# Patient Record
Sex: Male | Born: 1945 | Race: White | Hispanic: No | Smoking: Former smoker
Health system: Southern US, Community
[De-identification: ages and names within clinical notes are randomized; demographics above are authoritative.]

## PROBLEM LIST (undated history)

## (undated) DIAGNOSIS — I219 Acute myocardial infarction, unspecified: Secondary | ICD-10-CM

## (undated) DIAGNOSIS — G4733 Obstructive sleep apnea (adult) (pediatric): Secondary | ICD-10-CM

## (undated) DIAGNOSIS — E785 Hyperlipidemia, unspecified: Secondary | ICD-10-CM

## (undated) HISTORY — DX: Hyperlipidemia, unspecified: E78.5

## (undated) HISTORY — DX: Acute myocardial infarction, unspecified: I21.9

## (undated) HISTORY — DX: Obstructive sleep apnea (adult) (pediatric): G47.33

## (undated) HISTORY — PX: ANGIOPLASTY: SHX39

## (undated) HISTORY — PX: CHOLECYSTECTOMY: SHX55

## (undated) HISTORY — PX: COLON SURGERY: SHX602

---

## 1999-02-04 ENCOUNTER — Other Ambulatory Visit: Admission: RE | Admit: 1999-02-04 | Discharge: 1999-02-04 | Payer: Self-pay | Admitting: Urology

## 1999-03-08 ENCOUNTER — Encounter: Payer: Self-pay | Admitting: Urology

## 1999-03-10 ENCOUNTER — Encounter (INDEPENDENT_AMBULATORY_CARE_PROVIDER_SITE_OTHER): Payer: Self-pay | Admitting: *Deleted

## 1999-03-10 ENCOUNTER — Inpatient Hospital Stay (HOSPITAL_COMMUNITY): Admission: RE | Admit: 1999-03-10 | Discharge: 1999-03-13 | Payer: Self-pay | Admitting: Urology

## 1999-05-04 ENCOUNTER — Ambulatory Visit (HOSPITAL_COMMUNITY): Admission: RE | Admit: 1999-05-04 | Discharge: 1999-05-04 | Payer: Self-pay | Admitting: *Deleted

## 1999-05-04 ENCOUNTER — Encounter (INDEPENDENT_AMBULATORY_CARE_PROVIDER_SITE_OTHER): Payer: Self-pay

## 2000-09-06 ENCOUNTER — Encounter (INDEPENDENT_AMBULATORY_CARE_PROVIDER_SITE_OTHER): Payer: Self-pay | Admitting: Specialist

## 2000-09-06 ENCOUNTER — Ambulatory Visit (HOSPITAL_COMMUNITY): Admission: RE | Admit: 2000-09-06 | Discharge: 2000-09-06 | Payer: Self-pay | Admitting: *Deleted

## 2000-10-27 ENCOUNTER — Encounter: Admission: RE | Admit: 2000-10-27 | Discharge: 2000-11-07 | Payer: Self-pay | Admitting: Surgery

## 2000-10-27 ENCOUNTER — Encounter: Payer: Self-pay | Admitting: Surgery

## 2000-11-03 ENCOUNTER — Encounter (INDEPENDENT_AMBULATORY_CARE_PROVIDER_SITE_OTHER): Payer: Self-pay | Admitting: Specialist

## 2000-11-03 ENCOUNTER — Inpatient Hospital Stay (HOSPITAL_COMMUNITY): Admission: RE | Admit: 2000-11-03 | Discharge: 2000-11-07 | Payer: Self-pay | Admitting: Surgery

## 2002-07-02 ENCOUNTER — Ambulatory Visit (HOSPITAL_COMMUNITY): Admission: RE | Admit: 2002-07-02 | Discharge: 2002-07-02 | Payer: Self-pay | Admitting: *Deleted

## 2002-07-02 ENCOUNTER — Encounter (INDEPENDENT_AMBULATORY_CARE_PROVIDER_SITE_OTHER): Payer: Self-pay | Admitting: Specialist

## 2003-05-30 ENCOUNTER — Encounter (INDEPENDENT_AMBULATORY_CARE_PROVIDER_SITE_OTHER): Payer: Self-pay | Admitting: *Deleted

## 2003-05-30 ENCOUNTER — Ambulatory Visit (HOSPITAL_COMMUNITY): Admission: RE | Admit: 2003-05-30 | Discharge: 2003-05-30 | Payer: Self-pay | Admitting: *Deleted

## 2007-05-14 DIAGNOSIS — J984 Other disorders of lung: Secondary | ICD-10-CM | POA: Insufficient documentation

## 2007-05-19 ENCOUNTER — Ambulatory Visit: Payer: Self-pay | Admitting: Critical Care Medicine

## 2007-05-19 ENCOUNTER — Ambulatory Visit: Payer: Self-pay | Admitting: Cardiovascular Disease

## 2007-05-19 ENCOUNTER — Inpatient Hospital Stay (HOSPITAL_COMMUNITY): Admission: EM | Admit: 2007-05-19 | Discharge: 2007-06-01 | Payer: Self-pay | Admitting: Emergency Medicine

## 2007-05-22 ENCOUNTER — Encounter (INDEPENDENT_AMBULATORY_CARE_PROVIDER_SITE_OTHER): Payer: Self-pay | Admitting: Cardiology

## 2007-06-03 ENCOUNTER — Ambulatory Visit: Payer: Self-pay | Admitting: *Deleted

## 2007-06-04 ENCOUNTER — Inpatient Hospital Stay (HOSPITAL_COMMUNITY): Admission: EM | Admit: 2007-06-04 | Discharge: 2007-06-06 | Payer: Self-pay | Admitting: Emergency Medicine

## 2007-06-04 ENCOUNTER — Ambulatory Visit: Payer: Self-pay | Admitting: Internal Medicine

## 2007-06-05 ENCOUNTER — Encounter: Payer: Self-pay | Admitting: Pulmonary Disease

## 2007-06-06 ENCOUNTER — Encounter: Payer: Self-pay | Admitting: Pulmonary Disease

## 2007-07-12 ENCOUNTER — Ambulatory Visit: Payer: Self-pay | Admitting: Pulmonary Disease

## 2007-07-12 DIAGNOSIS — J8410 Pulmonary fibrosis, unspecified: Secondary | ICD-10-CM | POA: Insufficient documentation

## 2007-07-12 DIAGNOSIS — R05 Cough: Secondary | ICD-10-CM

## 2007-07-12 DIAGNOSIS — R058 Other specified cough: Secondary | ICD-10-CM | POA: Insufficient documentation

## 2007-07-12 DIAGNOSIS — J449 Chronic obstructive pulmonary disease, unspecified: Secondary | ICD-10-CM | POA: Insufficient documentation

## 2007-08-21 ENCOUNTER — Ambulatory Visit: Payer: Self-pay | Admitting: Pulmonary Disease

## 2007-08-21 DIAGNOSIS — I509 Heart failure, unspecified: Secondary | ICD-10-CM | POA: Insufficient documentation

## 2009-05-07 IMAGING — CR DG CHEST 1V PORT
1 series · 1 of 1 positions shown · non-contrast
Comparison: none
 The heart is enlarged.

CLINICAL DATA: 61 year-old with chest pain.  Code stemi.
 PORTABLE CHEST- 1 VIEW:

[view not recorded]
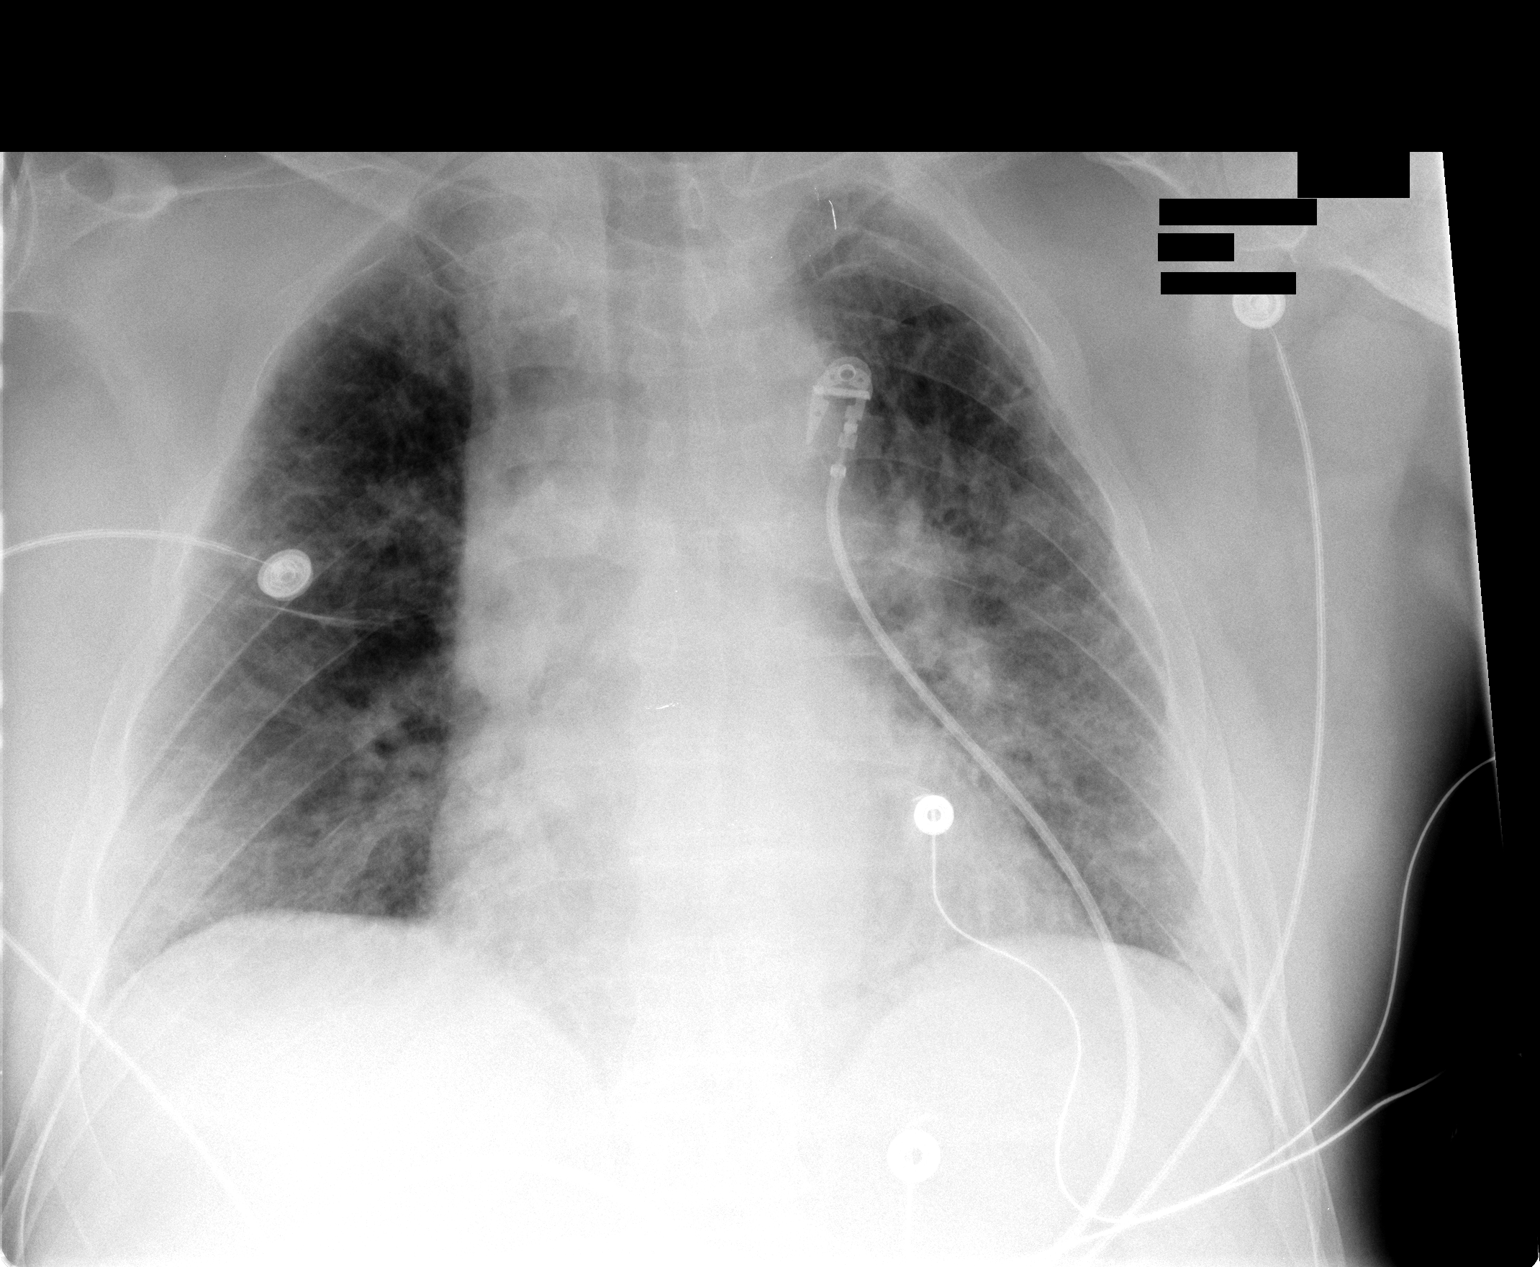

[1 of 1 positions shown; findings below may reference images not displayed]

There are patchy densities bilaterally, likely represents an interstitial pulmonary edema.  Infectious process is also a consideration however.
IMPRESSION: Cardiomegaly and bilateral densities, likely related to edema.

## 2009-05-09 IMAGING — CR DG CHEST 1V PORT
1 series · 1 of 1 positions shown · non-contrast
Comparison: 05/20/2007.

Portable AP chest, 05/21/2007.
INDICATION: Short of breath.

[view not recorded]
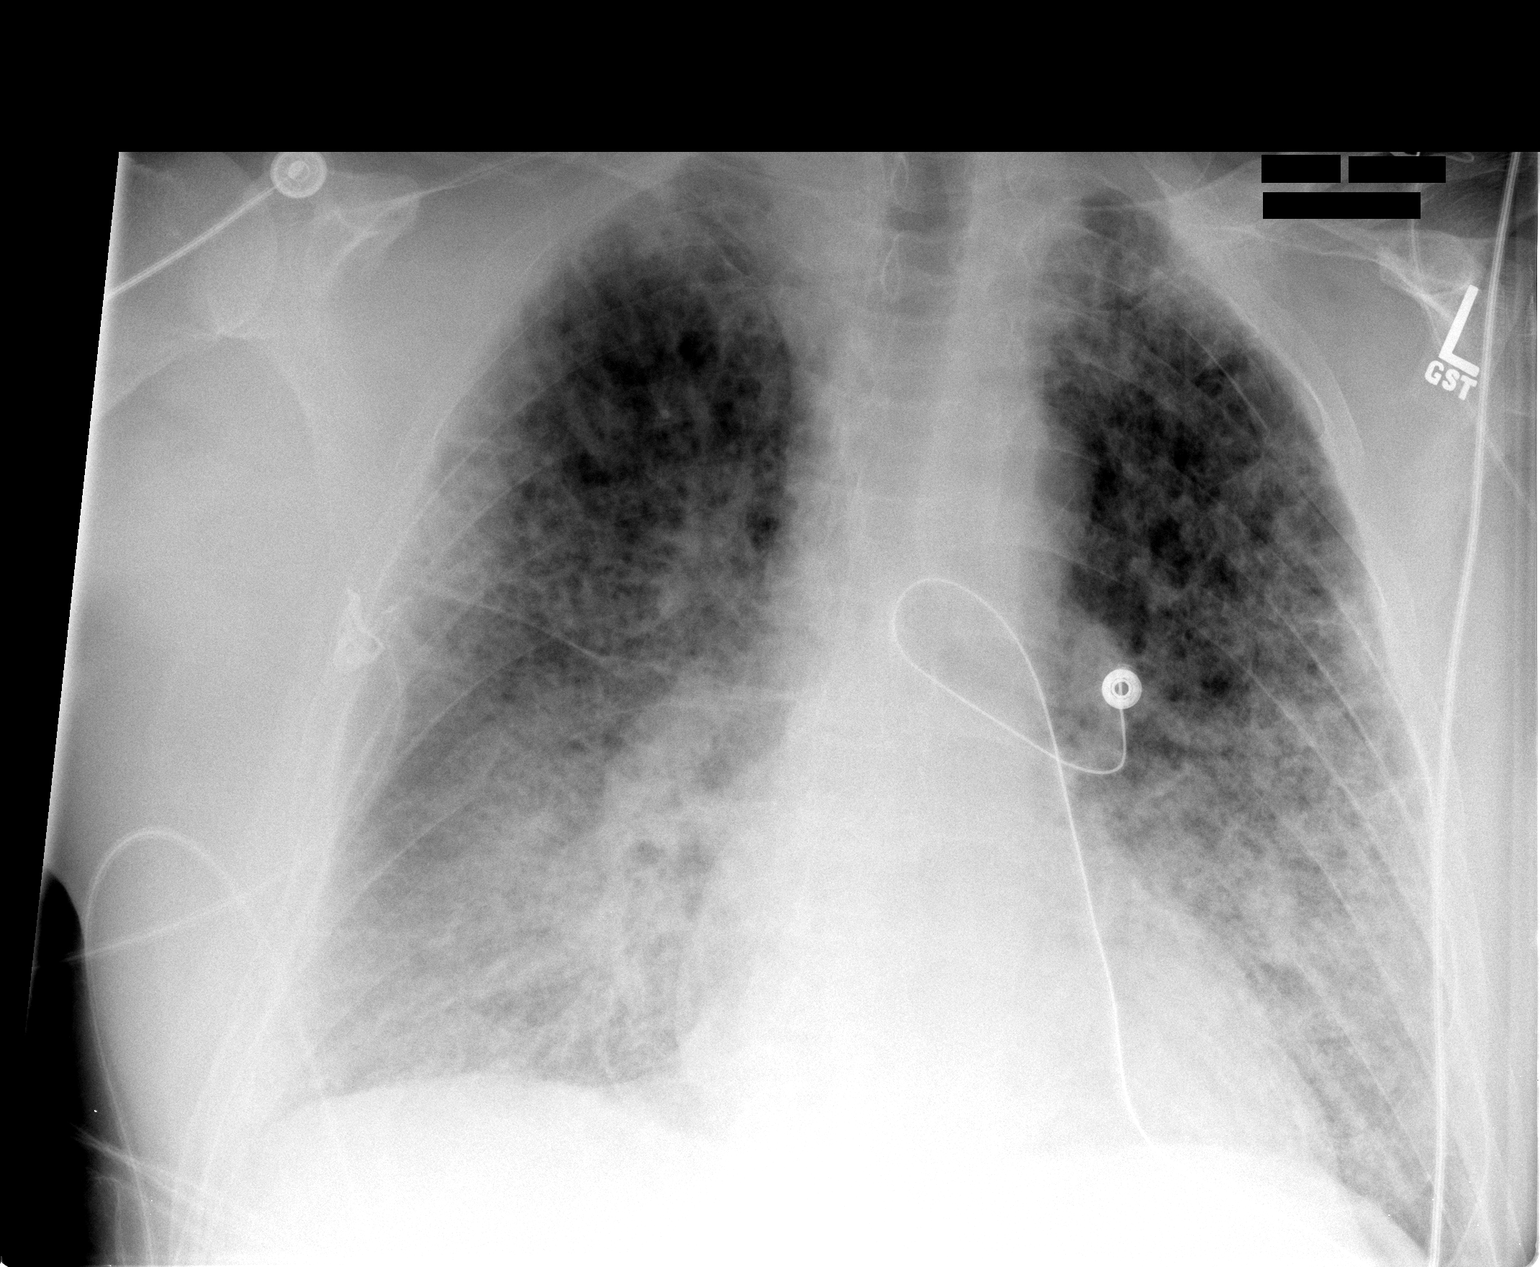

[1 of 1 positions shown; findings below may reference images not displayed]

FINDINGS: Since yesterday's exam, there has been interval development of florid
perihilar pulmonary edema superimposed on background pulmonary fibrosis.
Cardiomediastinal silhouette is unchanged. Left costophrenic angle excluded.
IMPRESSION: Development of severe perihilar pulmonary edema, likely cardiogenic.

## 2009-05-12 IMAGING — CR DG CHEST 1V PORT
1 series · 1 of 1 positions shown · non-contrast
Comparison: 05/23/07.

CLINICAL DATA: Myocardial infarction.

[view not recorded]
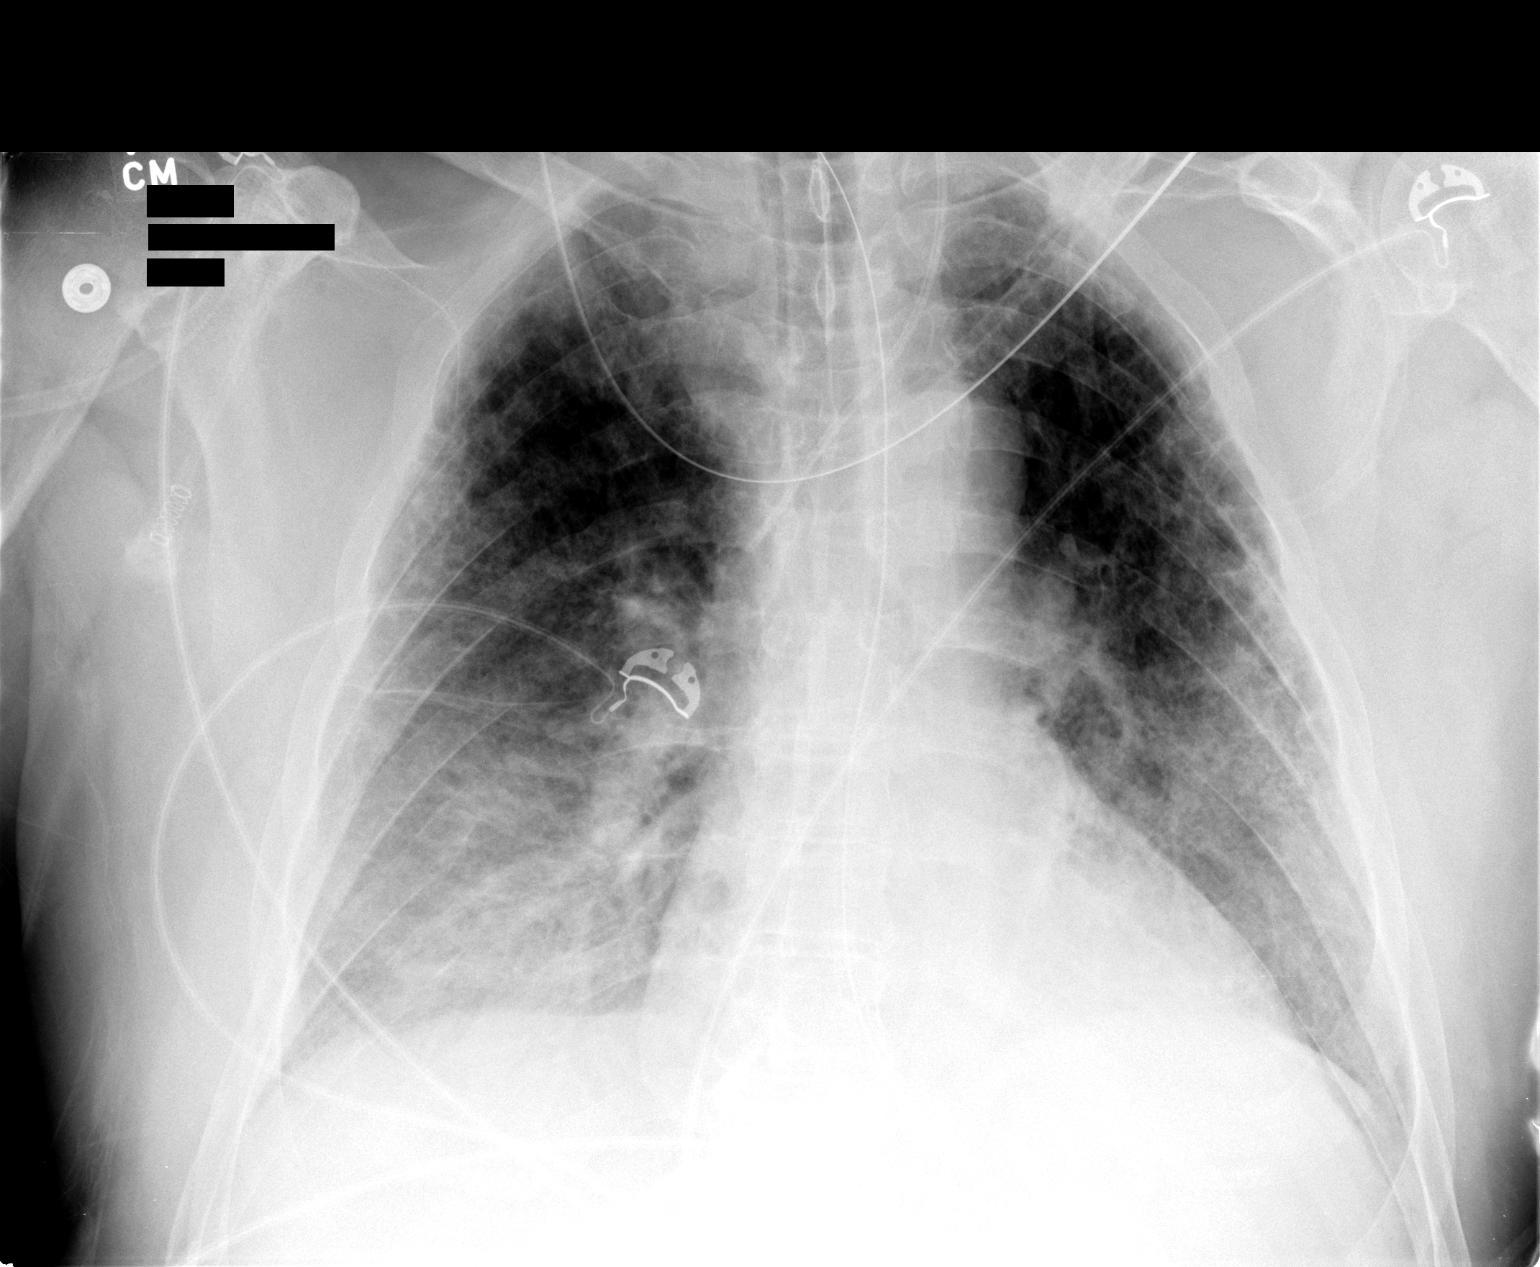

[1 of 1 positions shown; findings below may reference images not displayed]

FINDINGS: The patient has been intubated with the ETT in good position just below the clavicular heads.  Bilateral airspace disease persists although it is slightly improved. Heart size upper normal.
IMPRESSION: 1.  ET tube is now in place with the tip in good position.
 2.  Some improvement in aeration.

## 2009-05-13 IMAGING — CT CT ANGIO CHEST
2 of 8 series · 17 of 36 positions shown · IV contrast (100 ML OMNI 300)
Comparison: none

CLINICAL DATA: 61-year-old male with MI.  Rule out PE.
CT ANGIOGRAPHY OF CHEST:
TECHNIQUE: Multidetector CT imaging of the chest was performed during bolus injection of intravenous contrast.  Multiplanar CT angiographic image reconstructions were generated to evaluate the vascular anatomy.
Contrast:  100 cc Omnipaque 300

[Series 3: pe · axial · 0.80mm/px · z∈[-288,-21]mm · 14 of 247 slices shown]
[im 17/247  lung]
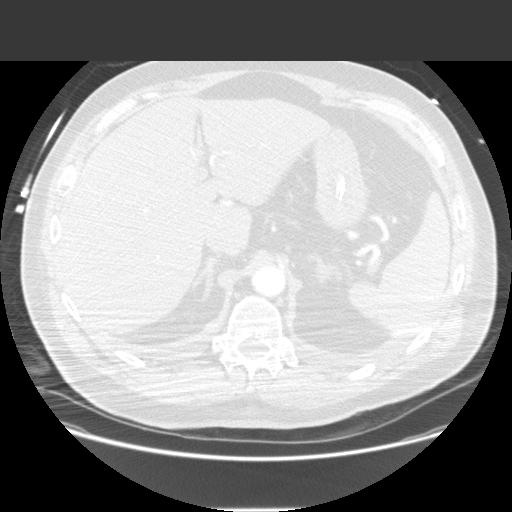
[im 33/247  mediastinal]
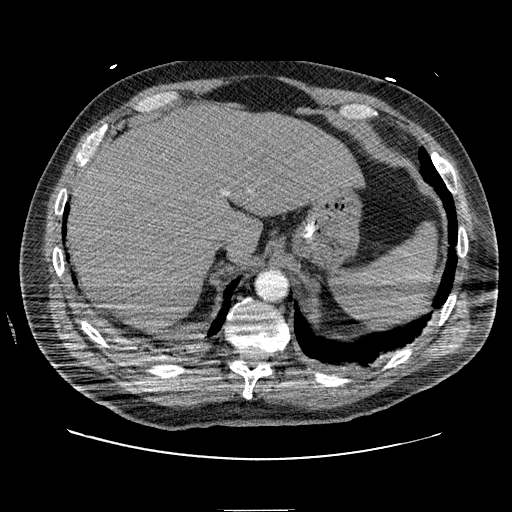
[im 50/247  lung]
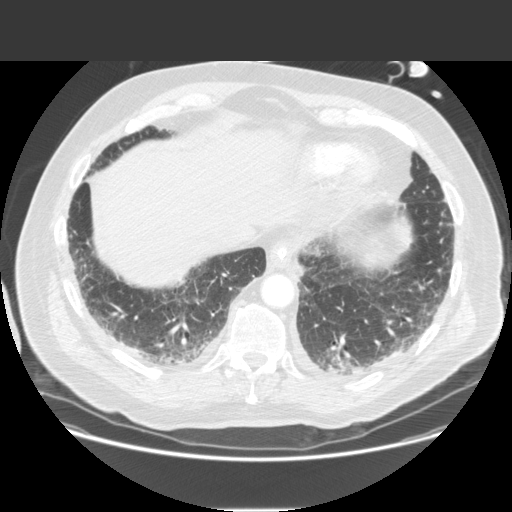
[im 66/247  mediastinal]
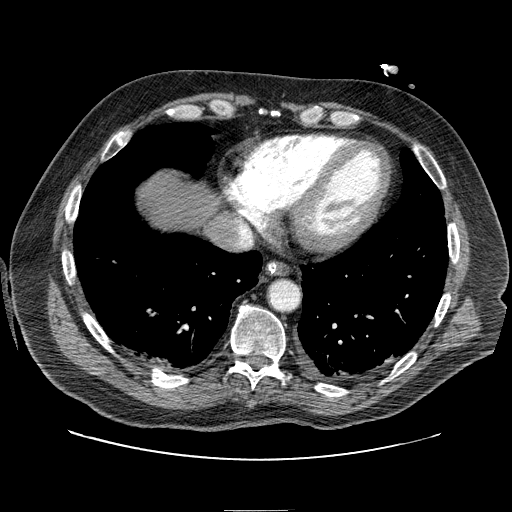
[im 83/247  lung]
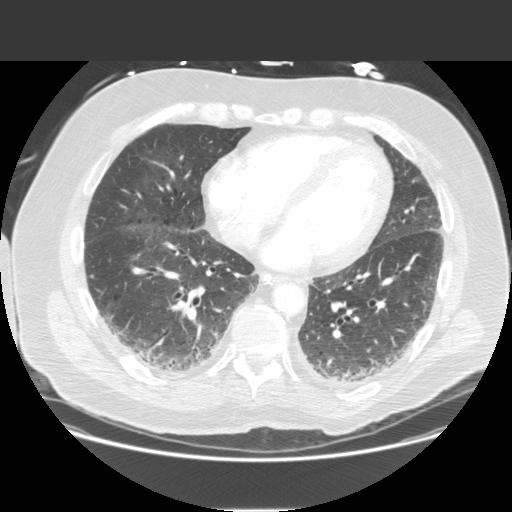
[im 99/247  mediastinal]
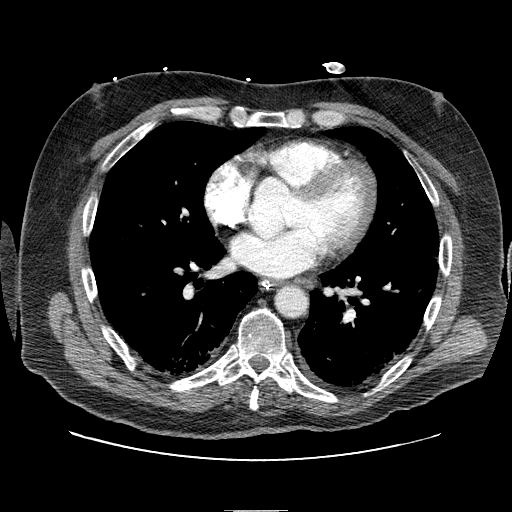
[im 115/247  lung]
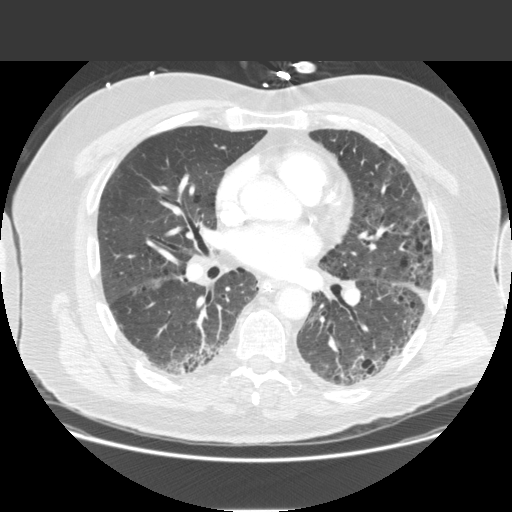
[im 132/247  mediastinal]
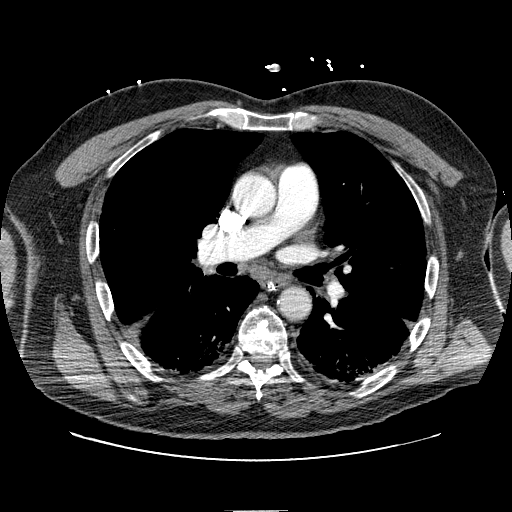
[im 148/247  lung]
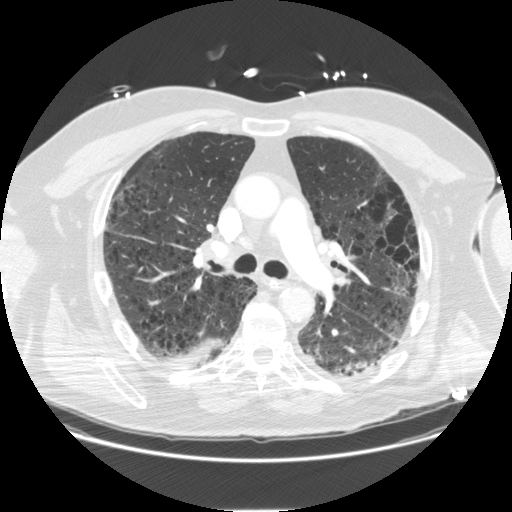
[im 165/247  mediastinal]
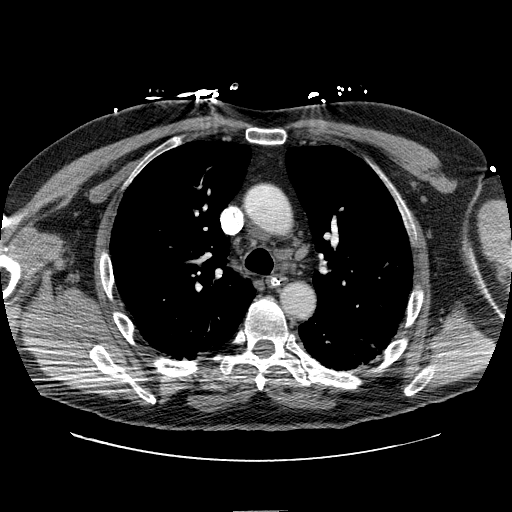
[im 181/247  lung]
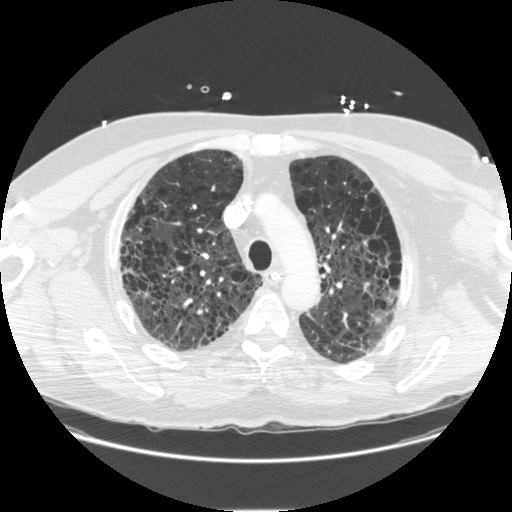
[im 197/247  mediastinal]
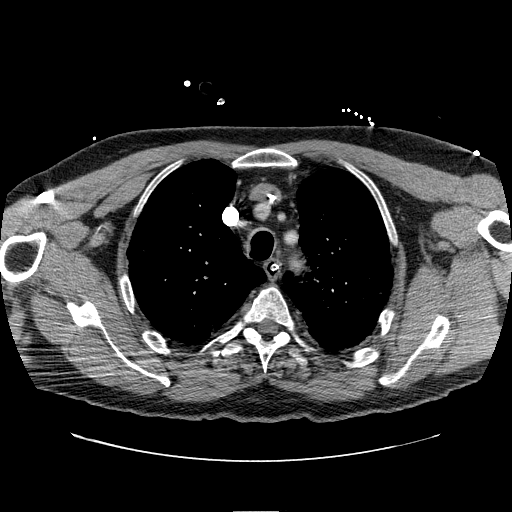
[im 214/247  lung]
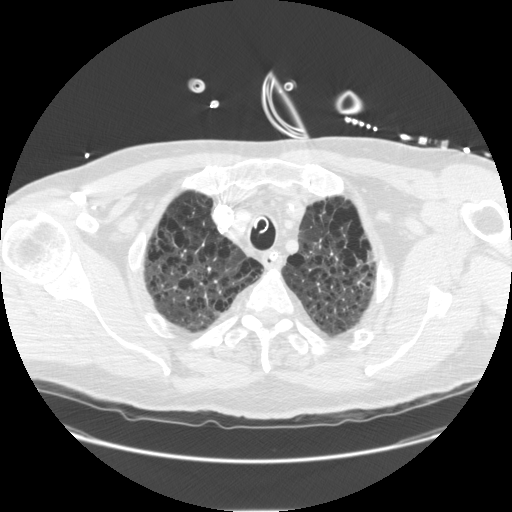
[im 230/247  mediastinal]
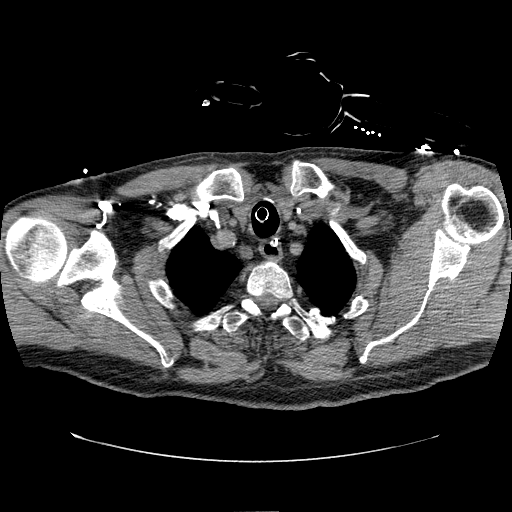

[Series 301: reformatted · coronal · 0.80mm/px · 3 of 159 slices shown]
[im 32/159  mediastinal]
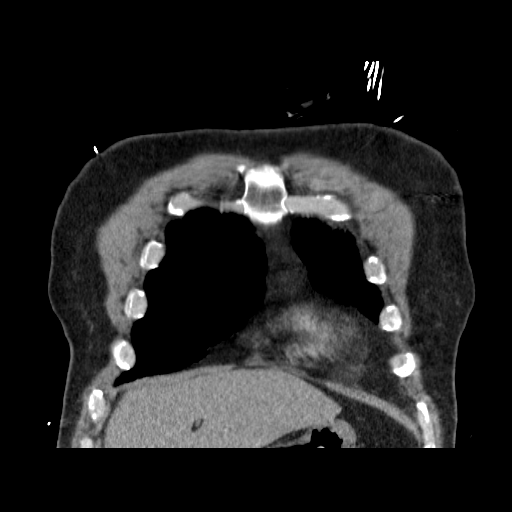
[im 64/159  mediastinal]
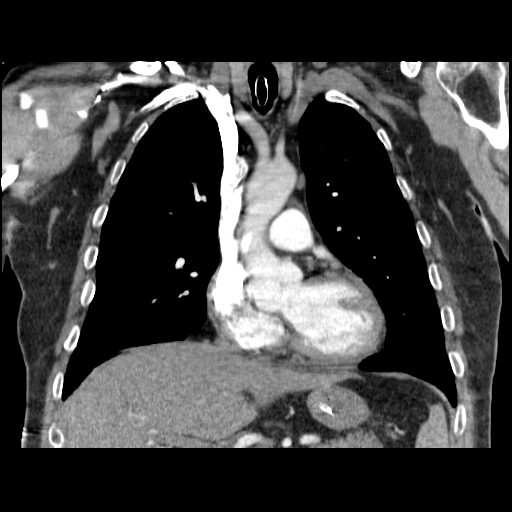
[im 95/159  mediastinal]
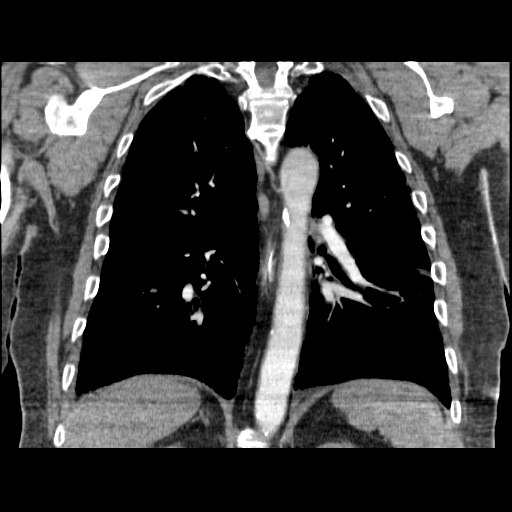

[17 of 36 positions shown; findings below may reference images not displayed]

FINDINGS: The study is technically adequate.  No filling defects within the pulmonary arteries to the subsegmental level to suggest acute pulmonary embolism.  The heart and great vessels appear normal.  There is coronary artery calcifications.  There is NG tube in the esophagus.  There are small 12 mm right hilar lymph nodes (image 54).  There are scattered paratracheal nodes including a 12 mm right lower paratracheal node and 11 mm subcarinal node.  
Review of the lung parenchyma with high-rise cuts demonstrates central lobular emphysema with upper lobe predominance.  This is severe in nature.  There is mild cystic change at the periphery of the lung base which does not quite meet the definition of honeycombing but does have the architectural distortion and cystic change peripherally.  There is no evidence of ground glass opacities.  No evidence of effusion, infiltrate, or pneumothorax.  
Limited view of the upper abdomen demonstrates 2 cm enlargement of the left adrenal gland.  This cannot be fully characterized on this study.  Otherwise unremarkable.  
Review of the skeleton demonstrates no acute aggressive osseous finding. 
7 mm noncalcified nodule in right middle lobe (image 76).
IMPRESSION: 1.  No evidence of pulmonary embolism.
2.  Severe central lobular emphysema with upper lobe predominance.  
3.  No evidence of ground glass opacities to suggest acute pulmonary process.  
4.  No evidence of pneumonia, effusion or pneumothorax.
5.  Paramediastinal and hilar lymphadenopathy is likely related to COPD however is nonspecific.
6.  2 cm enlargement of the left adrenal gland.  This cannot be fully characterized on this study.  Consider dedicated CT protocol or MRI protocol if further workup is desired.  
7.  7 mm nodule in the right middle lobe.  No comparison available.  Recommend followup CT in 3 months.

## 2009-06-09 ENCOUNTER — Encounter: Payer: Self-pay | Admitting: Pulmonary Disease

## 2010-04-29 NOTE — Letter (Signed)
[facility] [facility] Pulmonary Care Appointment Letter  [facility]  [address]   [place], [place] [address]   Phone: [phone]  Fax: [phone]    06/09/2009 MRN: [id number]  [facility] [address] [place], [place]  [address]  Dear [name],   Our office is attempting to contact you about an appointment.  Please call our office at [phone] to schedule this appointment with Dr. [name].  Our registration staff is prepared to assist you with any questions you may have.    Thank you,   [facility]   Appended Document: [place] [facility] Appointment Letter Per RA pt needs FU appointment.   Appended Document: [place] [facility] Appointment Letter Spoke to pt in regards to schedulling a follow up appt with Dr. [name].  He stated he goes through [place] for everything because of lack of insurance and that he didn't feel a need to schedule an appointment with Dr. [name].

## 2010-08-10 NOTE — Consult Note (Signed)
Bruce Winters, Bruce Winters            ACCOUNT NO.:  [id number]   MEDICAL RECORD NO.:  [id number]          PATIENT TYPE:  INP   LOCATION:  2007                         FACILITY:  [facility]   PHYSICIAN:  Oretha Milch, MD      DATE OF BIRTH:  [birth year]   DATE OF CONSULTATION:  DATE OF DISCHARGE:  06/06/2007                                 CONSULTATION   FOLLOW-UP CONSULTATION:  Note that the initial consultation was done by  Dr. Tyson Winters on March 10.   Bruce Winters is a pleasant 65 year old Caucasian gentleman who was  hospitalized from February 21-June 01, 2007, with an acute anterior wall  myocardial infarction complicated by ventricular fibrillation arrest.  He required PCI to proximal LAD and mechanical ventilation for a short  period of time.  A CT scan during this admission showed severe  centrilobular emphysema with some fibrotic changes at the periphery of  the lung.  Perimediastinal and hilar lymphadenopathy was noted.  A 7-mm  nodule was noted the right middle lobe.   He was readmitted on June 04, 2007, for chest pain and a repeat  catheterization showed that the stent to LAD was open and his EF had  improved to 45%.  We were consulted for intractable cough.   This seems to be worse with talking.  A swallow evaluation showed no  evidence of aspiration but showed evidence of vocal cord trauma.   Review of his labs shows a negative ANA, a negative ANCA and a high ESR  103 to 96.  A repeat CT scan shows unchanged nodule and evidence of  fibrosis.   CURRENT MEDICATIONS:  1. Lasix 40 mg p.o. daily.  2. Metoprolol 25 mg p.o. b.i.d.  3. Protonix 40 mg p.o. b.i.d.  4. Alprazolam 0.25 mg p.o. b.i.d. p.r.n.  5. Benzonatate 200 mg t.i.d.  6. Tussionex 5 mL p.o. b.i.d. p.r.n.   SOCIAL HISTORY:  He smoked about a pack per day from 14 to 42 years.  Works as a Research scientist (life sciences).  Complains about diesel exhaust.  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   IMPRESSION:  1. Likely chronic obstructive pulmonary disease with centrilobular      emphysema.  2. Some evidence of pulmonary fibrosis.  The peripheral and subpleural      distribution is consistent with idiopathic pulmonary fibrosis.  3. Intractable cough, likely related to vocal cord trauma  4. Did not tolerate Bruce Winters.  5. Recent anterior wall myocardial infarction, status post left      anterior descending artery stent.   RECOMMENDATIONS:  1. I called ENT and obtained an appointment for him today as an      outpatient with Dr. Lazarus Winters at 2:45 p.m.  He can go over there      right after discharge.  2. An outpatient appointment with me on July 12, 2007, when full PFTs      will be obtained.  If significant obstruction is observed, we will      try bronchodilators.  On the  other hand, if his PFTs are more      consistent with restriction, he will need serial follow-up for      worsening of pulmonary fibrosis.  3. Obviously, ACE inhibitors are to be avoided.  4. His high ESR remains unexplained.  An RA factor will also be      checked in the future.      Oretha Milch, MD  Electronically Signed     RVA/MEDQ  D:  06/06/2007  T:  06/07/2007  Job:  [id number]   cc:   Bruce Winters, M.D.

## 2010-08-10 NOTE — H&P (Signed)
[name], [name] NO.:  [id number]   MEDICAL RECORD NO.:  [id number]          PATIENT TYPE:  INP   LOCATION:  2626                         FACILITY:  [facility]   PHYSICIAN:  Unice Cobble, MD     DATE OF BIRTH:  Mar 25, 1946   DATE OF ADMISSION:  06/03/2007  DATE OF DISCHARGE:                              HISTORY & PHYSICAL   CARDIOLOGIST:  Georga Hacking, M.D.   CHIEF COMPLAINT:  Chest pain.   HISTORY OF PRESENT ILLNESS:  This is a 65 year old white male who was  discharged 3 days ago after an acute anterior myocardial infarction  complicated by ventricular fibrillation x3 and an ARDS-type picture.  The patient had left chest squeezing starting at 2130 with shortness of  breath.  He did not have diaphoresis, presyncope or palpitations at that  time.  He took two sublingual nitroglycerin without effect and called  emergency medical service who gave him aspirin.  Pain was finally  relieved after a lengthy period of time in the emergency department  where he is now on a nitroglycerin drip.  Otherwise he is doing well at  home with the exception of some dyspnea on exertion while walking up a  hill to his mother's house.  He does not have chest pain with walking on  flat surfaces, however.  This pain that he had last night is different  from his acute MI pain.  It is more local (without radiation) and not as  severe as prior.  He also notes that he has some pain lying on his left  side and has had a mild cough.   PAST MEDICAL HISTORY:  1. STEMI complicated by a V-fib arrest on May 19, 2007.  He is      status post PCI to the proximal LAD with mild coronary artery      disease in his other vessels.  His hospital course was complicated      by ARDS.  2. Prostate cancer, status post surgery.  3. Prior colon cancer causing obstruction, status post surgery.  4. Hypertension.  5. Hyperlipidemia.   ALLERGIES:  PENICILLIN.   MEDICATIONS:  1. Aspirin 325 mg  daily.  2. Plavix 75 mg daily.  3. Metoprolol 25 mg b.i.d.  4. Lipitor 40 mg daily.  5. Lasix 40 mg daily.   SOCIAL HISTORY:  The patient does not smoke and does not drink to  excess.   FAMILY HISTORY:  His mother had coronary artery disease.   REVIEW OF SYSTEMS:  A complete review of systems done found him to be  otherwise negative except as in the HPI.   PHYSICAL EXAMINATION:  VITAL SIGNS:  Temperature is 99.6 with a pulse  75, blood pressure of 93/32 with a respiratory rate of 23.  Oxygen  saturation are 97% on 2 L.  GENERAL:  He is in no acute distress.  HEENT:  PERRLA.  EOMI.  Mucous membranes moist.  Oropharynx without  erythema or exudates.  NECK:  Supple without lymphadenopathy, thyromegaly, bruits, or jugular  venous distention.  HEART:  Regular rate and rhythm with  a normal S1 and S2 without murmurs,  gallops or rubs.  There is a question of an S3.  PULSES:  2+ and equal bilaterally without bruits.  LUNGS:  Bilateral crackles with left being greater than the right.  SKIN:  Without rashes or lesions.  ABDOMEN:  Soft and nontender with normal bowel sounds.  No rebound or  guarding.  EXTREMITIES:  No cyanosis, clubbing or edema.  He has a bruise in his  right groin which is healing well.  NEUROLOGICALLY:  He is alert and oriented x3 with cranial nerves II-XII  grossly intact.  Strength 5/5 all extremities and axial groups with  normal sensation throughout.   RADIOLOGY:  His chest x-ray shows increased interstitial and patchy and  nodular air space opacities in the left peripheral lung and right base  which are suspicious for asymmetric edema or possibly pneumonia.   EKG:  His rate is 89 with a rhythm of normal sinus.  He has anterior T-  wave inversions as well as lateral T-wave inversions.  There are septal  Qs present.   LABORATORY DATA:  His troponin is negative.  Creatinine is 1.2.  Hemoglobin is 10.2.   ASSESSMENT/PLAN:  This is a 65 year old white male with  recent discharge  for anterior ST elevation myocardial infarction complicated by V-fib  arrest x3 in ARDS-like picture who presents with chest pain.  His chest  pain is unlike his prior chest pain in severity.  However, it is  concerning for new ischemia as it was relieved by nitroglycerin  eventually in the emergency department.  Differential diagnosis includes  in-stent thrombosis/stenosis versus noncardiac chest pain versus post-  PCI syndrome.  It is also possible that this could be secondary to a  brewing pneumonia in his left lower lobe.  Currently he is afebrile and  I will not treat for this at present.  Mechanical complication of  myocardial infarction is also possible.  I have no signs or symptoms to  lead me down this road yet, but it will be kept in the differential.  I  will need to rule him out for myocardial infarction tonight.  He will  need to be made n.p.o. for possible studies in the morning.  Otherwise I  will continue his home medications as well as the nitroglycerin drip and  I will administered Lovenox for his unstable angina-like picture.      Unice Cobble, MD  Electronically Signed     ACJ/MEDQ  D:  06/04/2007  T:  06/04/2007  Job:  [id number]

## 2010-08-10 NOTE — Cardiovascular Report (Signed)
[name], [name] NO.:  [id number]   MEDICAL RECORD NO.:  [id number]          PATIENT TYPE:  INP   LOCATION:  2626                         FACILITY:  [facility]   PHYSICIAN:  Georga Hacking, M.D.DATE OF BIRTH:  [birth year]   DATE OF PROCEDURE:  06/04/2007  DATE OF DISCHARGE:                            CARDIAC CATHETERIZATION   HISTORY:  A 65 year old male with a recent anterior MI complicated by  adult respiratory distress syndrome.  He was discharged home from the  hospital 3 days prior to admission, but had the onset of left chest pain  and became frightened, took a nitroglycerin and came to the emergency  room.  Initial cardiac enzymes were unremarkable.  He was brought to the  Cath Lab to determine whether he had stent patency or not.   PROCEDURE:  Left heart catheterization with coronary angiograms and left  ventriculogram,.   COMMENTS ABOUT THE PROCEDURE:  The right femoral artery was entered  using a single anterior needle wall stick.  Cath procedure was done with  6-French catheters and a 30 mL ventriculogram was performed.  He was  returned to the holding area for sheath removal following the procedure.   HEMODYNAMIC DATA:  Aorta post contrast 95/60, LV post contrast 95/4-12.   ANGIOGRAPHIC DATA:  LEFT VENTRICULOGRAM:  Performed in the 30 degree RAO projection.  The  aortic valve is normal.  The mitral valve is normal.  Left ventricle is  normal in size.  There is moderate anterior wall hypokinesis.  The apex  contracts relatively well.  Estimated ejection fraction is 45%.  Coronary arteries arise and distribute normally.  Mild calcification  involving the proximal circumflex.  The left main coronary artery is  normal.  The left anterior descending stent is widely patent with 0%  residual narrowing.  The diagonal branch that comes off within the stent  is patent.  There is a distal stenosis of 40% in the mid vessel after  the stent.  Circumflex  coronary artery is calcified and has mild  irregularity.  The right coronary artery has a shepherd's crook  configuration and a bend with mild narrowing proximally.  There is a mid  30% stenosis noted.   IMPRESSION:  1. Widely patent stent in the proximal left anterior descending with a      mild to moderate mid vessel stenosis still present.  2. Moderate anterior wall hypokinesis with an ejection fraction of      45%, but what appears to be improved LV      function since previously here.  3. Minimal disease involving the other vessels.   RECOMMENDATIONS:  Evaluation for other causes of pain.      Georga Hacking, M.D.  Electronically Signed     WST/MEDQ  D:  06/04/2007  T:  06/05/2007  Job:  [id number]   cc:   Veverly Fells. Excell Seltzer, MD

## 2010-08-10 NOTE — Discharge Summary (Signed)
Bruce Winters, Bruce Winters            ACCOUNT NO.:  [id number]   MEDICAL RECORD NO.:  [id number]          PATIENT TYPE:  INP   LOCATION:  2007                         FACILITY:  [facility]   PHYSICIAN:  Georga Hacking, M.D.DATE OF BIRTH:  June 24, 1945   DATE OF ADMISSION:  06/03/2007  DATE OF DISCHARGE:  06/06/2007                               DISCHARGE SUMMARY   FINAL DIAGNOSES:  1. Chest pain which is atypical of undetermined etiology.      a.     Catheterization showing patent drug-eluting stent was placed       two weeks ago.      b.     Moderate anterior hypokinesis.      c.     Only mild disease remaining.  2. Pulmonary fibrosis and emphysema noted on high-resolution spiral CT      scan.  3. Cough, questionable multifactorial.  4. Hypertension.  5. Hyperlipidemia under treatment.   PROCEDURES:  Cardiac catheterization, CT scan of chest.   CONSULTATIONS:  Dr. Micah Flesher.   HISTORY:  The patient is a 65 year old male who had a recent anterior  myocardial infarction complicated by acute ventricular fibrillation.  He  then developed acute respiratory distress syndrome and required  intubation and was eventually able to be discharged three days prior to  readmission here.  He had had some mild shortness of breath and cough.  He awoke the morning of admission with left chest pain described as a  sharp chest pain which was not pleuritic.  He did not have diaphoresis  or presyncope.  It was different than his presenting symptoms with the  heart attack.  He took two sublingual nitroglycerin without effect and  called EMS.  Pain was relieved after a lengthy period of time in the  emergency room.  He has had some mild dyspnea with walking but did not  have any chest pain with walking.  He has had significant cough.  He was  admitted to rule out a myocardial infarction.  Please see the previously  dictated history and physical for remainder of the details.   HOSPITAL COURSE:   Laboratory data on admission showed a white count of  9200, hemoglobin of 10.4, hematocrit 30.4, platelet count 485,000.  Chemistry panel showed a sodium of 134, potassium 4, chloride 100, CO2  26, BUN 11, creatinine 0.88, glucose is 104.  PT and PTT were normal.  Liver enzymes were normal.  Serial cardiac enzymes were negative.  D-  dimer was 0.66.  Sedimentation rate was 96.  Previous sedimentation rate  was 106.   The patient was admitted because of his recent chest pain.  Repeat  catheterization was advised.  This was done on June 04, 2007 with  findings of a widely patent stent in the proximal LAD with mild to  moderate mid vessel stenosis still present.  There was moderate anterior  hypokinesis with an ejection fraction of 45%, minimal disease elsewhere.  The patient continued to have significant cough and was noted to have  episodic oxygen desaturations.  He was seen by pulmonary medicine and  also had a  high-resolution spiral CT.  This was abnormal and showed  significant diarrhea home and emphysema as well as interstitial  fibrosis.  He had a markedly abnormal chest x-ray also.  He was seen by  the pulmonary consultants who felt that he could possibly have vocal  cord dysfunction, intermittent aspiration, or underlying lung disease.  They recommended a trial of 7 days of cough suppressants, consideration  of an ENT evaluation for vocal cord dysfunction, as well as proton pump  inhibitors.  At this time, he is improved and is feeling better.  His  oxygen saturations were better, and he has had no recurrent pain.  I  think the chest pain was noncardiac.   He is discharged at this time in improved condition on:  1. Aspirin 325 mg daily.  2. Plavix 75 mg daily.  3. Metoprolol 25 mg b.i.d.  4. Lipitor 40 mg daily.  5. Furosemide 40 mg daily.  6. He will also be on generic omeprazole 20 mg b.i.d.   I will see him in the office in follow-up in two weeks.  He is  instructed to  walk daily, is to call if there are recurrent problems.      Georga Hacking, M.D.  Electronically Signed     WST/MEDQ  D:  06/06/2007  T:  06/07/2007  Job:  [id number]   cc:   Tammy R. Collins [place], M.D.  Nelda Bucks, MD

## 2010-08-10 NOTE — Discharge Summary (Signed)
Bruce Winters, ALFIERI NO.:  [id number]   MEDICAL RECORD NO.:  [id number]          PATIENT TYPE:  INP   LOCATION:  2008                         FACILITY:  [facility]   PHYSICIAN:  Georga Hacking, M.D.DATE OF BIRTH:  Jul 17, 1945   DATE OF ADMISSION:  05/19/2007  DATE OF DISCHARGE:  06/01/2007                               DISCHARGE SUMMARY   FINAL DIAGNOSES:  1. Acute anterior myocardial infarction initial episode.      a.     Complicated by acute ventricular fibrillation x3.      b.     Stenting done of the proximal left anterior descending with       a bare metal stent in the proximal left anterior descending,       nonobstructive disease noted in the left circumflex and right       coronary artery.  2. Adult respiratory distress syndrome, probably due to fractured      capillary syndrome.  3. Eosinophilia of uncertain cause.  4. Obesity.  5. History of colon cancer and history of colonic resection.  6. Hyperlipidemia.   PROCEDURES:  Emergency cardiac catheterization, stenting of the LAD,  intubation and mechanical ventilation, 2-D echocardiogram, bronchial  alveolar lavage.   CONSULTATIONS:  [facility]. This 65 year old male has a  history of some mild chest discomfort but was in his usual state of  health until he awoke at 4:30 in the morning with sweating and  substernal discomfort. He waited and then came by EMS to the emergency  room and had ventricular fibrillation in route. He was found to have an  acute anterior infarction.  After arrival here, he had two further  episodes of ventricular fibrillation and required 2 episodes of  cardioversion.  He was given amiodarone 150 mg in the emergency room and  was brought emergently to the catheterization laboratory. Please see the  previously dictated history and physical for the remainder of the  details.   HOSPITAL COURSE:  The patient had mild elevation of glucose while he was  in the emergency  room. He was found to have an acute anterior infarction  with ventricular fibrillation.  He was taken directly to the cardiac  catheterization laboratory where he was found to have an occluded LAD.  There was thrombus of the diagonal ostium and a 30% stenosis in the  right coronary artery. There was anteroapical hypokinesis with an EF of  40%. He had a bare metal Vision 3.5 18-mm stent placed in the proximal  right coronary artery and had rescue of the diagonal branch.  He was  doing well following the procedure, the next morning complained of a  cough and resolution of his chest pain. He had transient systemic  hypotension the night prior to admission with decreased oxygen  saturations. He had an extensive anterior infarction and troponin then  rose to 87.  He was found to have some rales the next morning and was  given a dose of Lasix. The next afternoon he developed hypotension, low  grade fever, cough and was somewhat warm. Chest x-ray at the time showed  COPD and possible bilateral upper lobe infiltrates. He was cultured,  placed on antibiotics and Avelox. The fluid rate was increased and later  dopamine was added because of low blood pressure. After dopamine was  added, he then developed polymorphic ventricular tachycardia and  received boluses of amiodarone.  He the next morning was found to have  diffuse pulmonary edema and diffuse hypoxemia. He was diuresed and  diuresed about 5 liters.  He continued to be dyspneic with poor  oxygenation. He also had tachycardia the next morning.  By May 22, 2007, I was concerned about despite diuresis failure to oxygenate and  the respiratory distress pattern and pulmonary saw him in consultation.  The next day we stopped his dopamine and also discontinued the  amiodarone. An eosinophil level rose to 7%.  He had progressive  shortness breath, increased work of breathing and he needed to be  intubated on May 23, 2007. He was intubated  and required PEEP and  the differential possibility was either acute amiodarone toxicity versus  fractured capillary syndrome.  He gradually improved and was able to be  extubated on the evening of May 25, 2007. He was awake and alert  and remained stable over the weekend. We started him on low-dose beta-  blocker therapy and initially tried ACE inhibitors but he developed  significant cough on these. We later took him off the ACE inhibitors but  then he developed hypotension when he was changed to Diovan so we  discontinued that.  He was continued on furosemide and was able to be  moved to the floor.  He was seen by cardiovascular rehabilitation and  still had some cough although his breathing had improved.  He did have  some persistent infiltrates noted on x-ray that was thought possibly due  to underlying lung disease. By June 01, 2007, he had improved enough,  was ambulatory in the hall and was not having any arrhythmias and  it  was felt that he could be discharged home in improved condition.   DISCHARGE MEDICATIONS:  1. Aspirin 325 mg daily.  2. Plavix 75 mg daily.  3. Metoprolol 25 mg b.i.d.  4. Lipitor 40 mg daily.  5. Furosemide 40 mg daily.  6. Atorvastatin 40 mg daily.   He will be seen in the office in 1 week for follow-up.  He was given  discharge instructions regarding coronary artery disease management and  nitroglycerin therapy.  He will be seen in follow-up in 1 week and is to  call if there are recurrent problems.      Georga Hacking, M.D.  Electronically Signed     WST/MEDQ  D:  06/01/2007  T:  06/01/2007  Job:  [id number]   cc:   Tammy R. Collins Scotland, M.D.  Veverly Fells. Excell Seltzer, MD  [facility]

## 2010-08-10 NOTE — Cardiovascular Report (Signed)
[name], Bruce Winters NO.:  [id number]   MEDICAL RECORD NO.:  [id number]          PATIENT TYPE:  INP   LOCATION:  2807                         FACILITY:  [facility]   PHYSICIAN:  Georga Hacking, M.D.DATE OF BIRTH:  [birth year]   DATE OF PROCEDURE:  05/19/2007  DATE OF DISCHARGE:                            CARDIAC CATHETERIZATION   HISTORY OF PRESENT ILLNESS:  The patient is a 65 year old male who  presented with an acute anterior infarction, complicated by acute  ventricular fibrillation. He has currently continued to have chest  discomfort. He was brought emergently to the cardiac catheterization  laboratory for acute intervention.   DESCRIPTION OF PROCEDURE:  The patient was brought to the  catheterization laboratory from the emergency room. He was given a bolus  of heparin 5,000 units in the emergency room. His right femoral artery  was entered using a single anterior needle wall stick and using a single  anterior needle wall stick. A catheter was used for a 6 French left and  right coronary catheter and a 25 cc ventriculogram was performed. Dr.  Tonny Bollman then scrubbed into the case at that point and completed  the percutaneous intervention portion of that procedure.   HEMODYNAMIC DATA:  1. Aorta post contrast 115/80.  2. LV post contrast 115/25 to 30.   ANGIOGRAPHIC DATA:  LEFT VENTRICULOGRAM:  Performed in the 30 degree RAO  projection. The aortic valve is normal. Mitral valve has somecaptured  mitral regurgitation. There is anterior hypokinesis and apical  hypokinesis noted. Estimated ejection fraction is 45%. Coronary arteries  arise and strictly normal. No significant coronary calcification  present. Left main coronary artery is normal. Left anterior descending  is occluded proximally with a flush occlusion with no collateral flow  noted. Circumflex coronary artery has scattered irregularities and has  several marginal branches noted. The right  coronary artery contained  moderate irregularity and moderate aneurysmal coronary disease in the  mid portion but it has no significant obstructive stenosis noted.   IMPRESSION:  1. Coronary artery disease with acute occlusion of the left anterior      descending with acute anterior infarction.  2. Mild coronary disease involving the other vessels.   RECOMMENDATIONS:  Emergent percutaneous intervention by Dr. Excell Seltzer.      Georga Hacking, M.D.  Electronically Signed     WST/MEDQ  D:  05/19/2007  T:  05/20/2007  Job:  [id number]   cc:   Tammy R. Collins Scotland, M.D.  Veverly Fells. Excell Seltzer, MD

## 2010-08-10 NOTE — Consult Note (Signed)
Bruce Winters, SACKMANN NO.:  [id number]   MEDICAL RECORD NO.:  [id number]          PATIENT TYPE:  INP   LOCATION:  2912                         FACILITY:  [facility]   PHYSICIAN:  Cassell Clement, M.D. DATE OF BIRTH:  1945-09-02   DATE OF CONSULTATION:  05/20/2007  DATE OF DISCHARGE:                                 CONSULTATION   This is a 65 year old Caucasian gentleman who was admitted on May 19, 2007 with an acute anterior wall myocardial infarction.  He  fibrillated shortly after presentation and then was brought emergently  to the cardiac cath lab where he underwent successful PCI of the LAD  with a bare metal stent by Dr. Excell Seltzer.  He was found to have moderate  left ventricular dysfunction and increased left ventricle end-diastolic  pressure.  Postoperatively, he has been on aspirin and Plavix and  received 18 hours and Integrilin.  This afternoon, I was called to see  the patient because of problems with hypotension, low grade fever, and  cough.  Initially, the patient was in no acute distress but complained  he did not feel well.   PHYSICAL EXAMINATION:  VITAL SIGNS:  His exam at that time showed normal  sinus rhythm with frequent PACs and a blood pressure of 80/45.  SKIN:  Warm and flushed.  CHEST:  A few bibasilar rales.  HEART:  S4 gallop, no pericardial rub.  ABDOMEN:  Nontender.  EXTREMITIES:  Well-perfused.   He had a portable chest which showed COPD, no frank congestive heart  failure, and possible bilateral upper lobe infiltrates.   We initially cultured his blood and urine and attempted a sputum culture  and then started him on Avelox.   ALLERGIES:  PENICILLIN.   For his low blood pressure, we gave him a trial of increasing his IV  fluid rate.  However, the patient's blood pressure remained low in the  80s, and he did not respond to fluid, so we put him on dopamine at 5  mcg/kg per minute.  Shortly after starting the dopamine, his  blood  pressure came up to 105 systolic, but then he started having polymorphic  runs of ventricular tachycardia.  The dopamine was stopped temporarily.  He was started on IV amiodarone with a bolus of 150 mg followed by a  drip.  His blood pressure subsequently drifted back down into the 80s,  and we restarted his dopamine at a lower dose of 2.5 mcg/kg per minute.  He had some subsequent ventricular polymorphic tachycardia, and we gave  him another 150 bolus of amiodarone and continued is amiodarone drip.  Stat labs were sent off.  Foley catheter was placed by the nurse.  His  EKG from earlier today was reviewed and does show a very large anterior  wall myocardial infarction with a QS pattern all across the precordium,  V1 through V6, consistent with a large infarct.  It is felt that his  complications of hypotension and ventricular arrhythmia are secondary to  the large infarct.  His condition remains critical but stable at  present, and he will be continued on  amiodarone drip and low-dose dopamine for blood pressure support.  His  O2 saturations have been brought up into the 90% range on a non-  rebreather mask.   CRITICAL CARE TIME:  70 minutes.           ______________________________  Cassell Clement, M.D.     TB/MEDQ  D:  05/20/2007  T:  05/21/2007  Job:  [id number]

## 2010-08-10 NOTE — H&P (Signed)
NAMEBENOIT, Bruce Winters NO.:  [id number]   MEDICAL RECORD NO.:  [id number]          PATIENT TYPE:  INP   LOCATION:  2912                         FACILITY:  [facility]   PHYSICIAN:  Georga Hacking, M.D.DATE OF BIRTH:  [birth year]   DATE OF ADMISSION:  05/19/2007  DATE OF DISCHARGE:                              HISTORY & PHYSICAL   HISTORY:  The patient is a 65 year old male who has a previous history  of some chest pain several years ago.  He was in his usual state of  health until he awoke at 4:30 a.m. with sweating and substernal chest  discomfort.  He came in by emergency medical services and had  ventricular fibrillation reportedly en route.  After arrival here he  went into ventricular fibrillation and was shocked twice more.  He was  found to have an acute anterior infarction.  He was given amiodarone 150  mg in the emergency room and was brought to the catheterization  laboratory.   PAST MEDICAL HISTORY:  He has a history of prostate cancer treated with  surgery and has a history of previous colon cancer.  Has a previous  history of a gastrointestinal problems.  His hypertension and lipid  status is unknown at the present time.   PREVIOUS SURGERY:  Removal of colon for obstruction.  He has also had  some sort of prostate surgery.   ALLERGIES:  To PENICILLIN.   He says he is on no current medications.   FAMILY HISTORY:  Positive for coronary artery disease in his mother.   SOCIAL HISTORY:  He is a nonsmoker, does not use alcohol to excess.  He  currently lives alone.  His elderly mother is also a patient of mine.   A sketchy review of systems is otherwise unremarkable.   EXAMINATION:  He is a pleasant white male who is lethargic from the  morphine but is awake.  His blood pressure was 110/80, pulse was 70 and  regular.  SKIN:  Warm and dry.  ENT:  EOMI.  PERLA.  C&S clear.  No carotid bruits.  LUNGS:  Clear.  CARDIAC EXAM:  Normal S1 and S2.  No S3 or  murmur.  ABDOMEN:  Soft, nontender.  Midline scar noted.  Femoral pulses deep and  2+.  Posterior tibial pulses 2+.   EKG shows an acute anterior infarction.  Creatinine is 1.   IMPRESSION:  1. Acute anterior infarction.  2. Ventricular fibrillation.  3. Obesity.  4. History of colon cancer and history of colon resection.  5. Prostate cancer by history.   RECOMMENDATIONS:  Brought to the catheterization laboratory for acute  catheterization.  The procedure was discussed with the patient fully  including risks.  Dr. Excell Seltzer will be the interventional cardiologist.  The patient was given heparin in the emergency room and aspirin.      Georga Hacking, M.D.  Electronically Signed     WST/MEDQ  D:  05/19/2007  T:  05/20/2007  Job:  [id number]   cc:   Tammy R. Collins Scotland, M.D.  Veverly Fells. Excell Seltzer, MD

## 2010-08-10 NOTE — Cardiovascular Report (Signed)
Bruce Winters, Bruce Winters            ACCOUNT NO.:  [id number]   MEDICAL RECORD NO.:  [id number]          PATIENT TYPE:  INP   LOCATION:  2807                         FACILITY:  [facility]   PHYSICIAN:  Veverly Fells. Excell Seltzer, MD  DATE OF BIRTH:  Aug 06, 1945   DATE OF PROCEDURE:  05/19/2007  DATE OF DISCHARGE:                            CARDIAC CATHETERIZATION   PROCEDURE:  PTCA and stenting of the proximal LAD, AngioSeal right  femoral artery.  Diagnostic catheterization performed by Dr. Donnie Aho.   INDICATIONS:  Bruce Winters is a 65 year old gentleman who presented with  an acute anterolateral MI.  He had ventricular fibrillation in the  emergency department.  He was brought emergently to the cardiac cath  lab.   Bruce Winters is a patient of Dr. York Spaniel.  Dr. Donnie Aho performed  diagnostic coronary angiography on an emergent basis.  Left  ventriculography was performed as well.  The patient had received 5000  units of heparin in the emergency department, and his initial ACT was in  the range of 225.  An additional 1000 units of heparin was given and a  double bolus and drip of Integrilin were given at the beginning of the  interventional procedure.  Bruce Winters was found to have nonobstructive  plaque in the right coronary artery and left circumflex with acute  proximal occlusion of the LAD.  A 6-French sheath was present in the  right femoral artery.  A 6-French XB LAD 4-cm guide catheter was  inserted.  A cougar guidewire was passed beyond the area of occlusion  into the distal LAD and reperfusion occurred with the wire crossing the  lesion.  A 2.5 x 15 mm Fire Star balloon was used to predilate the area  of critical stenosis and it was dilated to 10 atmospheres.  Following  balloon inflation, there was improvement in LAD flow.  And there was  thrombus present in the ostium of a diagonal branch arising from the  area of the lesion.  Post PTCA angiography demonstrated a relatively  large  vessel.  I elected to treat the LAD and planned on rescuing the  diagonal branch after stenting if necessary.  The LAD was stented with  the 3.5 x 18-mm Vision stent which was deployed at 14 atmospheres.  The  stent was well expanded.  Following stenting, it appeared to match the  vessel size well.  There was TIMI III flow in the LAD after stenting and  TIMI I-II flow in the diagonal branch.  At that point, I wired the  diagonal branch with a whisper wire.  I was unable to pass a balloon  across the proximal portion of the stent in spite of rewiring the vessel  on 2 separate occasions to make sure that I was not a behind a stent  strut.  Two separate 1.5-mm balloons were tried, and neither would  cross.  At that point, I elected to postdilate the LAD stent and a 3.75  x 15-mm Dura Star balloon was used and taken to 18 atmospheres on 2  inflations to cover the entire stented segment.  After postdilatation,  there  was improved flow in the diagonal branch.  The ostium appeared  improved, although there remained a minor filling defect.  I did not  think another attempt at ballooning the ostium would help the diagonal  flow much.  The wire and guide catheter were removed.  There was an  excellent angiographic result in the LAD with TIMI III flow in the  vessel.  Femoral angiography demonstrated an entry point in the common  femoral artery, and an Angio-Seal device was used to close the  arteriotomy.   FINDINGS FROM DIAGNOSTIC ANGIOGRAPHY:  The right coronary artery had  mild plaque in the midportion with no more than 30-40% stenosis.  It was  a dominant vessel with a large PDA branch and small posterolateral  branch.   Left circumflex was a large vessel that supplied a very large second OM  branch and moderate size posterolateral branch.  There was is only minor  stenosis in the proximal portion of the circumflex of no more than 10-  20%.   The LAD was occluded proximal to the first  diagonal branch.  It was also  a large vessel.  Following reperfusion, the LAD wrapped around the LV  apex and was a large vessel throughout supplying a moderate size first  diagonal branch.   Left ventriculography showed distal anterior and apical akinesis with  otherwise preserved LV function.   ASSESSMENT:  1. Anterolateral ST elevation myocardial infarction secondary proximal      left anterior descending occlusion.  2. Successful PCI of the left anterior descending with bare metal      stenting.  3. Nonobstructive left circumflex and right coronary artery disease.  4. Moderate left ventricular dysfunction.  5. Increased left ventricular end-diastolic pressure.   PLAN:  Bruce Winters is admitted to the CCU under the care of Dr. Donnie Aho.  He will receive aspirin, Plavix, and 18 hours of Integrilin.      Veverly Fells. Excell Seltzer, MD  Electronically Signed     MDC/MEDQ  D:  05/19/2007  T:  05/19/2007  Job:  [id number]   cc:   Georga Hacking, M.D.

## 2010-08-13 NOTE — Op Note (Signed)
[facility]  Patient:    Bruce Winters, Bruce Winters                   MRN: [id number] Proc. Date: 11/03/00 Adm. Date:  [id number] Attending:  Andre Lefort CC:         Heywood Footman, N.P., [facility]  Teena Irani. Arlyce Dice, M.D., [facility]  Sabino Gasser, M.D.  Rozanna Boer., M.D.   Operative Report  DATE OF BIRTH:  Aug 22, 1945  CCS [id number]  PREOPERATIVE DIAGNOSIS:  Villous adenoma of right colon.  POSTOPERATIVE DIAGNOSIS:  Villous adenoma of right colon, final pathology pending.  PROCEDURE:  Right hemicolectomy with end-to-end ileal to colon anastomosis.  SURGEON:  Sandria Bales. Ezzard Standing, M.D.  FIRST ASSISTANT:  Maisie Fus B. Samuella Cota, M.D.  ANESTHESIA:  General endotracheal.  ESTIMATED BLOOD LOSS:  150 cc.  DRAINS LEFT:  There were none.  INDICATIONS FOR PROCEDURE:  Bruce Winters is a 65 year old white male, who had a colonoscopy by Dr. Sabino Gasser and was found to have a colonic polyp of his ascending colon, which on biopsy appears to be a tubulovillous adenoma. Discussion was carried out about colonoscopic versus open resection of his colon.  The patient wanted to proceed with a colon resection for removal of this polyp.  The patient has agreed also to the adalor study, and permission has been given for this.  DESCRIPTION OF PROCEDURE:  The patient presented to the operating room.  He completed a mechanical and antibiotic bowel prep the day before surgery.  His abdomen was shaved, prepped with Betadine solution and sterilely draped.  A Foley catheter was placed, and an oral gastric tube was placed.  A long midline incision was made with sharp dissection carried down to the abdominal cavity.  The abdomen was explored.  The right and left lobes of the liver were unremarkable.  The gallbladder was absent.  The small bowel was run from the ligament of Treitz to the terminal ileum and was unremarkable.   The appendix was absent.  In the right colon, I could feel a soft, flat mass which was probably about 3 cm in diameter, and this was felt to be the tumor that Dr. Virginia Rochester had identified.  The transverse colon, left colon, and sigmoid colon were otherwise unremarkable.  The pelvis was unremarkable.  There was no evidence of any disease outside of the colon.  Attention was then turned to the colon.  The right colon was mobilized, moving the right colon to the midline, freeing up the lateral attachments.  The ureter was identified and was well posterior to the dissection.  His duodenum actually swept fairly laterally and was left behind with the dissection.  The gallbladder bed was visualized which had some fat in it, and this was left alone.  After the colon had been mobilized, the terminal ileum was divided between [place] clamps, and then the right transverse colon was divided between [place] clamps.  The mesentery of the colon was then taken down using 2-0 silk sutures, and the specimen was then removed.  I opened the specimen at the bedside and found an approximate 3 cm villous adenoma and an adjacent 1.5 cm villous adenoma in his mid right colon.  These had a benign appearance. Permanent pathology is pending.  I then closed the mesentery using a 2-0 chromic suture.  I did an end-to-end anastomosis of his colon using 3-0 silk suture in an inverting fashion.  I then used  Gambee sutures on the last 3-4 sutures on the anterior wall.  I then buttressed the anastomosis with the omentum/mesentery.  I then irrigated the abdomen with 2 liters of saline, returned the colon to the right side of the abdomen.  After the abdomen had been irrigated, I then closed the abdomen with two running #1 PDS sutures with some interrupted Novofils about every fourth or fifth suture.  The skin was then irrigated and the skin closed with a staple. The patient tolerated the procedure well.  His NG tube was removed at  the end of the case.  Again, he is on the adalor study.  He will be followed in the hospital.  Estimated blood loss was about 150 cc. DD:  11/03/00 TD:  11/04/00 Job: [id number]

## 2010-08-13 NOTE — Op Note (Signed)
[place]. [facility]  Patient:    Bruce Winters                    MRN: [id number] Proc. Date: 03/10/99 Adm. 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  POSTOPERATIVE DIAGNOSIS:  Clinical B2 right-sided Glisson 3+4 adenocarcinoma of the prostate, pending pathology report.  PROCEDURES: 1. Right orchiectomy with pubic prostatectomy. 2. Bilateral pelvic lymph node dissection.  SURGEON:  Rozanna Boer., M.D.  ASSISTANT:  Verl Dicker, M.D.  ANESTHESIA:  General.  DESCRIPTION OF PROCEDURE: The patient was placed on the operating table in the supine position after satisfactory induction of general endotracheal anesthesia, with a roll towel under his sacrum.  He was shaved, prepped and draped with Betadine in usual sterile fashion.  A #28 2-Foley was inserted and the balloon inflated to 30 cc.  A midline incision was made and carried down between the umbilicus and symphysis pubis, to the midline fascia which was opened.  The retroperitoneal space was entered and the Buchwalter retractor was inserted and  used for exposure.  The patients right pelvic nodes were first addressed.  They were taken as a packet from the external iliac down to, and including, the obturator fossa back to the  bifurcation of the iliac as a single packet.  The efferent lymphatics were either clipped or tied as they were encountered.  Hemostasis was adequate.  The obturator nerve was carefully preserved, as were the vessels.  These felt to be benign fatty nodes.  On the patients left side a similar procedure was done.  The obturator nerve packet was taken from the external iliac down to, and including, the obturator fossa back to the bifurcation of the iliac (again as a separate packet).  Again, the obturator nerve was carefully preserved and hemostasis was good.  The endopelvic fascia was then incised and then puboprostatic ligaments were cut. There was a little bleeding of the dorsal vein on the patients right side at cutting the right puboprostatic ligament, but using the ______ clamp passed underneath the dorsal vein complex, this was doubly tied with #1 Vicryl and back-tied with 0 Vicryl suture.  Another 0 Vicryl was placed on the bladder neck and the dorsal vein complex was then incised. The urethra was not exposed; a right angle clamp was placed underneath this and elevated with umbilical tapes, as the urethra was cut at the apex of the prostate under direct vision.  The Foley catheter was then brought through this incision and used for traction anteriorly, as the prostate was dissected off the rectum posteriorly.  The patient was quite oozy at this point.  The pedicle was then taken close to the prostate; first on the right side back to the seminal vesicles, and then on the left.  The seminal vesicles were then exposed in the midline and the tips were dissected free; a right angle placed around the mid portion of the seminal vesicles, tied at this point. These were transected.  Attention was then turned at the bladder neck.  The bladder neck fibers were carefully dissected free with a Hemostat and the Foley with a fine-needle electrode.  The prostate, however, was quite stuck inferiorly into the bladder.  The bladder neck was dissected free and incised.  The prostate was then dissected completely off the bladder posteriorly.  The vas deferens were then identified nd clipped before removing the prostate.  The bladder neck was quite good, just admitting my small finger, and seemed to fairly continent without anything. Tacking sutures were 4.0 chromic cat gut were used to tack the mucosa of the urethra back to the bladder in an everting fashion.  When this  was done a Loma Boston sound was then passed into the urethra; 2.0 Vicryl sutures were then placed through the urethra at 2, 5, 7, 9 and 12 oclock.  A #20 Foley catheter was then inserted through the urethra and a loop of nylon passed around this tip.  As this was passed into the bladder the nylon suture was then brought out through a separate stab n the anterior bladder wall.  The balloon was inflated to 15 cc.  The corresponding urethral stitches were placed on the bladder neck.  When these were tied down the anastomosis was watertight, as evidenced by irrigation of the Foley.  The suture on the end of the Foley was then brought out to the patients left side, tied over  button.  A J-P drain was brought through a separate stab incision on the patients right side and left in the retroperitoneal space.  This was tied with nylon suture to the skin.  The midline fascia was then closed with running PDS #1, and the skin closed with clips.  Sponge, instrument and needle counts were correct on two occasions.  The patient was then taken to the recovery room in good condition. He did receive two units of autologous blood intraoperatively.  His estimated blood loss was about 1200 cc.  He remained stable throughout. DD:  03/10/99 TD:  03/11/99 Job: [id number]

## 2010-08-13 NOTE — Op Note (Signed)
Bruce Winters, WEIL                      ACCOUNT NO.:  [id number]   MEDICAL RECORD NO.:  [id number]                   PATIENT TYPE:  AMB   LOCATION:  ENDO                                 FACILITY:  [facility]   PHYSICIAN:  Georgiana Spinner, M.D.                 DATE OF BIRTH:  1945/10/01   DATE OF PROCEDURE:  05/30/2003  DATE OF DISCHARGE:                                 OPERATIVE REPORT   PROCEDURE:  Colonoscopy.   INDICATIONS:  Colon polyps.   ANESTHESIA:  1. Demerol 20.  2. Versed 1 mg.   DESCRIPTION OF PROCEDURE:  With patient mildly sedated in the left lateral  decubitus position, the Olympus videoscopic colonoscope was inserted in the  rectum, passed under direct vision to the neocecum, identified by the small  bowel mucosa, which was photographed.  From this point, the colonoscope was  then slowly withdrawn, taking circumferential views of the colonic mucosa,  stopping only in the neocecum where there was a question of a polyp at the  point of a suture.  It was noted within granulation tissue, but it was  biopsied using hot biopsy forceps technique, setting of 20-20 blended  current.  There were some other adjacent areas that looked polypoid, and  they were also removed using the same hot biopsy forceps technique.  Once  accomplished, the colonoscope was then slowly withdrawn taking  circumferential views of the remaining colonic mucosa, stopping then only in  the rectum which appeared normal on direct and retroflexed view.  The  endoscope was then straightened and pulled through the anal canal.  The  patient's vital signs and pulse oximeter remained stable.  The patient  tolerated the procedure well without apparent complications.   FINDINGS:  Polyps of neocecum versus granulation tissue.   PLAN:  1. Await biopsy report.  2. The patient will call me for results and follow up with me as an     outpatient.  3. See endoscopy note for further details.                                Georgiana Spinner, M.D.    GMO/MEDQ  D:  05/30/2003  T:  05/30/2003  Job:  [id number]   cc:   [facility] [place]

## 2010-08-13 NOTE — Op Note (Signed)
   Bruce Winters, Bruce Winters                      ACCOUNT NO.:  [id number]   MEDICAL RECORD NO.:  [id number]                   PATIENT TYPE:  AMB   LOCATION:  ENDO                                 FACILITY:   PHYSICIAN:  Georgiana Spinner, M.D.                 DATE OF BIRTH:  Aug 13, 1945   DATE OF PROCEDURE:  07/02/2002  DATE OF DISCHARGE:                                 OPERATIVE REPORT   PROCEDURE:  Upper endoscopy.   INDICATIONS:  Barrett's esophagus.   ANESTHESIA:  Demerol 60 mg, Versed 6 mg.   DESCRIPTION OF PROCEDURE:  With the patient mildly sedated in the left  lateral decubitus position, the Olympus videoscopic endoscope was inserted  through the mouth and passed under direct vision through the esophagus,  which appeared normal until it reached the distal esophagus.  There were  areas of what appeared to be Barrett's esophagus, tongue-like, pointed, and  areas of gastric-appearing tissue rising up into the esophageal lumen.  These were photographed and biopsied.  Going into the stomach, the fundus,  body and antrum were viewed and the body was somewhat erythematous and it  was biopsied as well.  Duodenal bulb and second portion of the duodenum  appeared normal.  From this point, the endoscope was slowly withdrawn taking  circumferential views and the entire duodenal mucosa was visualized until  the endoscope then pulled back into the stomach, placed in retroflexion  __________  from below.  The endoscope was then straightened, withdrawn,  taking circumferential views of the remaining gastric and esophageal mucosa.  The patient's vital signs and pulse oximetry remained stable.  The patient  tolerated the procedure well without apparent complication.   FINDINGS:  Erythema in the body of the stomach, biopsied.  Await biopsy  report.  This may be H. pylori gastritis.  Areas of presumed Barrett's  esophagus, biopsied.  Await biopsy report. The patient will call me for  results and  follow up with me as an outpatient.                                                   Georgiana Spinner, M.D.    GMO/MEDQ  D:  07/02/2002  T:  07/02/2002  Job:  [id number]   cc:   Teena Irani. Arlyce Dice, M.D.  [address]  [place]  [place] [address]  Fax: [phone]   Heywood Footman

## 2010-08-13 NOTE — Op Note (Signed)
[facility]  Patient:    Bruce Winters, Bruce Winters                   MRN: [id number] Adm. Date:  [id number] Attending:  Sabino Gasser CC:         Hey[place] Footman, N.P., [facility]   Operative Report  PROCEDURE:  Colonoscopy.  INDICATIONS:  Colon cancer screening in a gentleman of 65 years of age.  ANESTHESIA:  Demerol 40 mg, Versed 4 mg.  DESCRIPTION OF PROCEDURE:  With patient mildly sedated in the left lateral decubitus position, the rectal exam was performed which was unremarkable.  At this time, subsequently, the Olympus videoscopic variable stiffness colonoscope was inserted in the rectum and passed under direct vision to the cecum identified by the ileocecal valve and appendiceal orifice, both of which were photographed.  From this point, the colonoscope was slowly withdrawn taking circumferential views of the entire colonic mucosa, stopping only in the ascending colon just about two folds removed from the ileocecal valve where a very large polyp/mass was seen and multiple photographs were taken. It adhered to one wall of the colon and skipped a short area and then down the other side to the point where it was approximately almost three-quarters of the way circumferential with one skip area in between.  After we had assessed the length of this polyp and photographed it, we took multiple biopsies of both portions of the polyp/mass to await the biopsy report.  The endoscope was then withdrawn taking circumferential views of the remaining colonic mucosa, stopping only then in the rectum, which appeared normal in direct and retroflex view.  The endoscope was straight and withdrawn.  The patients vital signs and pulse oximetry remained stable.  The patient tolerated the procedure well without apparent complications.  FINDINGS:  Prep was slightly suboptimal and therefore, small lesions could be seen but other than #2, this large polyp, there were  no other lesions seen.  PLAN:  Await biopsy report.  If benign, it will give consideration for possible endoscopic removal.  Given the caveat with the size of this polyp, there would be an increased risk for bleeding and perforation and obviously, if malignant, a surgical referral which will have patient call me for the results of this biopsy report later this week and follow up with me as an outpatient as appropriate. DD:  09/06/00 TD:  09/06/00 Job: [id number]

## 2010-08-13 NOTE — Discharge Summary (Signed)
[place]  Patient:    Bruce Winters, Bruce Winters Visit Number: [id number] MRN: [id number]          Service Type: SUR Location: 4W [id number] 02 Attending Physician:  Andre Lefort Adm. Date:  11/03/2000 Disc. Date: 11/07/2000   CC:         Teena Irani. Arlyce Dice, M.D.  Bruce Winters, M.D.  Rozanna Boer., M.D.  Derry Skill, N.P.; [facility]   Discharge Summary  DISCHARGE DIAGNOSES: 1. Villous adenoma 3 cm of the right colon with no invasive carcinoma    identified. 2. Status post prostatectomy for prostate cancer.  No evidence of disease at    this time. 3. History of esophagitis. 4. On Adalor study.  OPERATIONS PERFORMED:  Patient had an open right hemicolectomy on November 03, 2000.  HISTORY OF PRESENT ILLNESS:  This is a 65 year old white male seen by Bruce Winters at [facility] who had a colonoscopy by Dr. Sabino Winters on September 06, 2000 which revealed a colonic polyp which was sessile in his right colon.  Discussion was carried out between Bruce Winters and Bruce Winters about the options for colonoscopic resection versus open resection and the patient elected to proceed with open resection of his colonic polyp. I did take care of him for a laparoscopic cholecystectomy I did on him in 1997.  PAST MEDICAL HISTORY:  Otherwise noncontributory.  Reflux esophagitis.  PAST SURGICAL HISTORY:  Prostatectomy by Dr. Vic Winters for prostate cancer in 2000, but apparently is disease free at this time from that.  PHYSICAL EXAMINATION  VITAL SIGNS:  Weight 233 pounds.  ABDOMEN:  Soft without tenderness, without guarding, without mass.  Stools were guaiac negative.  He completed a mechanical and antibiotic bowel prep at home and presented to the hospital on November 03, 2000 for surgery.  He has agreed to participate in the Adalor study regarding bowel motility.  On the day of admission he underwent a right  hemicolectomy without incident. Postoperatively he did well.  His liver functions remained stable.  He was rapidly advanced to a regular diet and his IV fluids were stopped on the third postoperative day.  By the fourth postoperative day he had a bowel movement, tolerating his diet without nausea.  Temperature was 98.1, pulse 77.  He was ready for discharge.  His final pathology showed a tubovillous adenoma of the right colon with no invasive carcinoma and 10 lymph nodes which were all negative.  His white blood count from the 13th was 7000, hemoglobin 12.7, platelet count 232,000.  Sodium 135, potassium 4.1, glucose 102, albumin 3.5, AST 52.  His other liver functions were all within normal limits.  DISCHARGE MEDICATIONS:  Vicodin for pain.  ACTIVITY:  No heavy lifting for at least four weeks.  DIET:  No restrictions.  FOLLOW-UP:  He is to see me back for staple removal within about five to seven days and call for an appointment.  CONDITION ON DISCHARGE:  Good. Attending Physician:  Andre Lefort DD:  11/15/00 TD:  11/16/00 Job: [id number]

## 2010-08-13 NOTE — Op Note (Signed)
Bruce Winters, Bruce Winters                      ACCOUNT NO.:  [id number]   MEDICAL RECORD NO.:  [id number]                   PATIENT TYPE:  AMB   LOCATION:  ENDO                                 FACILITY:  [facility]   PHYSICIAN:  Georgiana Spinner, M.D.                 DATE OF BIRTH:  [birth year]   DATE OF PROCEDURE:  05/30/2003  DATE OF DISCHARGE:                                 OPERATIVE REPORT   PROCEDURE:  Upper endoscopy.   INDICATIONS:  Gastroesophageal reflux disease with history of Barrett's.   ANESTHESIA:  1. Demerol 60 mg.  2. Versed 6 mg.   DESCRIPTION OF PROCEDURE:  With patient mildly sedated in the left lateral  decubitus position, the Olympus videoscopic endoscope was inserted in the  mouth, passed under direct vision through the esophagus, which appeared  normal until we reached the distal esophagus which was photographed and  biopsied.  We then entered into the stomach.  Fundus, body, antrum, duodenal  bulb, second portion of duodenum all appeared normal.  From this point, the  endoscope was slowly withdrawn, taking circumferential views of the duodenal  mucosa until the endoscope was pulled back in the stomach, placed in  retroflexion to view the stomach from below.  The endoscope was then  straightened and withdrawn, taking circumferential views of the remaining  gastric and esophageal mucosa.  The patient's vital signs and pulse oximeter  remained stable.  The patient tolerated the procedure well without apparent  complications.   FINDINGS:  Question of Barrett's esophagus, biopsied.  Await biopsy report.  The patient will call me for results and follow up with me as an outpatient.  Proceed to colonoscopy as planned.                                               Georgiana Spinner, M.D.    GMO/MEDQ  D:  05/30/2003  T:  05/30/2003  Job:  [id number]   cc:   [facility] [place]

## 2010-08-13 NOTE — Procedures (Signed)
[facility]  Patient:    Bruce Winters, Bruce Winters                   MRN: [id number] Proc. Date: 09/06/00 Adm. Date:  [id number] Attending:  Sabino Gasser CC:         Bethanie Dicker, N.P., Bertram Savin. [facility]   Procedure Report  PROCEDURE:  Upper endoscopy.  INDICATION FOR PROCEDURE:  Known Barretts esophagus with continued reflux symptomatology.  ANESTHESIA:  Demerol 60, Versed 6 mg.  DESCRIPTION OF PROCEDURE:  With the patient mildly sedated in the left lateral decubitus position, the Olympus video endoscope was inserted in the mouth and passed under direct vision through the esophagus. The distal esophagus was approached and showed changes of Barretts esophagus once again. There was some mild irritation and possibly superficial ulceration seen. This was all photographed and multiple biopsies were taken. Once accomplished, the endoscope was advanced into the stomach. The fundus, body, antrum, duodenal bulb, and second portion of the duodenum were visualized and photographs taken. From this point, the endoscope was slowly withdrawn taking circumferential views of the entire duodenal mucosa until the endoscope was then pulled back into the stomach, placed in retroflexion to view the stomach from below and this was photographed. The endoscope was then straightened and withdrawn taking circumferential views of the remaining gastric and esophageal mucosa which otherwise appeared normal. The patients vital signs and pulse oximeter remained stable. The patient tolerated the procedure well without apparent complications.  FINDINGS:  Changes of Barretts esophagus as previously, biopsied. Await biopsy report. The patient was switched to Protonix which he seems to be tolerating and will continue that for the time being. Will have the patient call me for results of biopsy and followup with me in as an outpatient. Proceed to colonoscopy. DD:  09/06/00 TD:   09/06/00 Job: [id number]

## 2010-12-20 LAB — BLOOD GAS, ARTERIAL
Acid-Base Excess: 0.3
Acid-Base Excess: 5.7 — ABNORMAL HIGH
Bicarbonate: 23.8
Bicarbonate: 24.9 — ABNORMAL HIGH
Bicarbonate: 30.4 — ABNORMAL HIGH
Bicarbonate: 30.8 — ABNORMAL HIGH
MECHVT: 500
O2 Saturation: 88.7
O2 Saturation: 92.6
O2 Saturation: 99.3
O2 Saturation: 99.5
PEEP: 12
PEEP: 12
Patient temperature: 98.6
Patient temperature: 98.6
Patient temperature: 99
Patient temperature: 99.6
RATE: 16
RATE: 16
TCO2: 24.8
TCO2: 25.9
TCO2: 31.8
TCO2: 31.9
pCO2 arterial: 46.6 — ABNORMAL HIGH
pH, Arterial: 7.429
pH, Arterial: 7.443
pH, Arterial: 7.462 — ABNORMAL HIGH
pH, Arterial: 7.534 — ABNORMAL HIGH
pO2, Arterial: 184 — ABNORMAL HIGH
pO2, Arterial: 58.4 — ABNORMAL LOW

## 2010-12-20 LAB — BASIC METABOLIC PANEL
BUN: 17
BUN: 17
BUN: 18
BUN: 18
BUN: 19
BUN: 11
BUN: 14
BUN: 20
CO2: 24
CO2: 31
CO2: 32
CO2: 30
Calcium: 7.6 — ABNORMAL LOW
Calcium: 7.8 — ABNORMAL LOW
Calcium: 8.2 — ABNORMAL LOW
Calcium: 8.5
Calcium: 8.6
Chloride: 102
Chloride: 95 — ABNORMAL LOW
Chloride: 97
Chloride: 102
Chloride: 94 — ABNORMAL LOW
Chloride: 94 — ABNORMAL LOW
Creatinine, Ser: 0.9
Creatinine, Ser: 0.92
Creatinine, Ser: 0.92
Creatinine, Ser: 0.84
Creatinine, Ser: 0.93
Creatinine, Ser: 1
GFR calc Af Amer: >60
GFR calc Af Amer: >60
GFR calc Af Amer: >60
GFR calc non Af Amer: >60
GFR calc non Af Amer: >60
GFR calc non Af Amer: >60
GFR calc non Af Amer: >60
Glucose, Bld: 125 — ABNORMAL HIGH
Glucose, Bld: 128 — ABNORMAL HIGH
Glucose, Bld: 149 — ABNORMAL HIGH
Glucose, Bld: 154 — ABNORMAL HIGH
Glucose, Bld: 169 — ABNORMAL HIGH
Glucose, Bld: 118 — ABNORMAL HIGH
Glucose, Bld: 147 — ABNORMAL HIGH
Glucose, Bld: 97
Potassium: 3.1 — ABNORMAL LOW
Potassium: 3.2 — ABNORMAL LOW
Potassium: 3.9
Potassium: 4.1
Potassium: 3.4 — ABNORMAL LOW
Potassium: 3.8
Potassium: 3.9
Potassium: 4
Potassium: 4.1
Sodium: 132 — ABNORMAL LOW
Sodium: 132 — ABNORMAL LOW
Sodium: 135
Sodium: 133 — ABNORMAL LOW
Sodium: 135

## 2010-12-20 LAB — CBC
HCT: 29.1 — ABNORMAL LOW
HCT: 32.7 — ABNORMAL LOW
HCT: 34.3 — ABNORMAL LOW
HCT: 30.4 — ABNORMAL LOW
HCT: 30.8 — ABNORMAL LOW
HCT: 30.9 — ABNORMAL LOW
HCT: 31.1 — ABNORMAL LOW
HCT: 31.5 — ABNORMAL LOW
HCT: 32.4 — ABNORMAL LOW
Hemoglobin: 10 — ABNORMAL LOW
Hemoglobin: 12 — ABNORMAL LOW
Hemoglobin: 9.9 — ABNORMAL LOW
Hemoglobin: 10.9 — ABNORMAL LOW
Hemoglobin: 11.1 — ABNORMAL LOW
MCHC: 34.5
MCHC: 34.5
MCHC: 34.7
MCHC: 34.8
MCHC: 36
MCHC: 34.2
MCHC: 34.5
MCV: 90.3
MCV: 91
MCV: 91.8
MCV: 90.5
MCV: 90.6
MCV: 91.6
Platelets: 184
Platelets: 201
Platelets: 255
Platelets: 333
Platelets: 408 — ABNORMAL HIGH
Platelets: 485 — ABNORMAL HIGH
Platelets: 514 — ABNORMAL HIGH
RBC: 2.94 — ABNORMAL LOW
RBC: 3.4 — ABNORMAL LOW
RBC: 4.54
RBC: 3.45 — ABNORMAL LOW
RBC: 3.56 — ABNORMAL LOW
RDW: 12.9
RDW: 12.9
RDW: 13
RDW: 13.1
RDW: 13.1
RDW: 12.9
RDW: 13
RDW: 13
RDW: 13.2
RDW: 13.4
WBC: 11.5 — ABNORMAL HIGH
WBC: 8.2
WBC: 8.6
WBC: 10.7 — ABNORMAL HIGH
WBC: 11.2 — ABNORMAL HIGH

## 2010-12-20 LAB — I-STAT 8, (EC8 V) (CONVERTED LAB)
Acid-base deficit: 2
Acid-base deficit: 1
BUN: 16
Bicarbonate: 21.4
Chloride: 101
Glucose, Bld: 147 — ABNORMAL HIGH
Glucose, Bld: 105 — ABNORMAL HIGH
HCT: 42
Hemoglobin: 14.3
Operator id: 284251
Potassium: 4.3
Potassium: 4
Sodium: 134 — ABNORMAL LOW
TCO2: 22
pH, Ven: 7.425 — ABNORMAL HIGH

## 2010-12-20 LAB — COMPREHENSIVE METABOLIC PANEL
ALT: 54 — ABNORMAL HIGH
ALT: 27
AST: 33
Albumin: 2.7 — ABNORMAL LOW
Alkaline Phosphatase: 92
BUN: 26 — ABNORMAL HIGH
CO2: 30
Calcium: 7.8 — ABNORMAL LOW
Calcium: 7.9 — ABNORMAL LOW
Calcium: 8.5
Chloride: 99
Creatinine, Ser: 0.97
GFR calc Af Amer: >60
GFR calc non Af Amer: >60
Glucose, Bld: 117 — ABNORMAL HIGH
Glucose, Bld: 104 — ABNORMAL HIGH
Potassium: 3.6
Potassium: 4
Sodium: 135
Sodium: 137
Sodium: 134 — ABNORMAL LOW
Total Bilirubin: 0.8
Total Protein: 5.8 — ABNORMAL LOW
Total Protein: 6.2

## 2010-12-20 LAB — CULTURE, RESPIRATORY W GRAM STAIN: Culture: NORMAL

## 2010-12-20 LAB — RENAL FUNCTION PANEL
Albumin: 2.5 — ABNORMAL LOW
Chloride: 92 — ABNORMAL LOW
GFR calc non Af Amer: >60
Phosphorus: 2.5
Potassium: 3.1 — ABNORMAL LOW
Sodium: 133 — ABNORMAL LOW

## 2010-12-20 LAB — B-NATRIURETIC PEPTIDE (CONVERTED LAB)
Pro B Natriuretic peptide (BNP): 463 — ABNORMAL HIGH
Pro B Natriuretic peptide (BNP): 489 — ABNORMAL HIGH

## 2010-12-20 LAB — DIFFERENTIAL
Basophils Absolute: 0
Basophils Absolute: 0.1
Basophils Absolute: 0.1
Basophils Absolute: 0.1
Basophils Relative: 0
Basophils Relative: 0
Basophils Relative: 0
Basophils Relative: 1
Basophils Relative: 1
Basophils Relative: 1
Eosinophils Absolute: 0.4
Eosinophils Absolute: 0.3
Eosinophils Relative: 3
Eosinophils Relative: 5
Eosinophils Relative: 6 — ABNORMAL HIGH
Lymphocytes Relative: 10 — ABNORMAL LOW
Lymphocytes Relative: 11 — ABNORMAL LOW
Lymphocytes Relative: 23
Lymphs Abs: 0.9
Monocytes Absolute: 0.8
Monocytes Absolute: 0.9
Monocytes Absolute: 0.5
Monocytes Absolute: 1
Monocytes Relative: 10
Monocytes Relative: 8
Monocytes Relative: 9
Monocytes Relative: 11
Neutro Abs: 5.2
Neutro Abs: 6.3
Neutro Abs: 6.9
Neutro Abs: 7.1
Neutro Abs: 6.8
Neutrophils Relative %: 61
Neutrophils Relative %: 74
Neutrophils Relative %: 78 — ABNORMAL HIGH
Neutrophils Relative %: 67

## 2010-12-20 LAB — EXPECTORATED SPUTUM ASSESSMENT W GRAM STAIN, RFLX TO RESP C

## 2010-12-20 LAB — ANA: Anti Nuclear Antibody(ANA): NEGATIVE

## 2010-12-20 LAB — CK TOTAL AND CKMB (NOT AT ARMC)
CK, MB: 1.9
Relative Index: INVALID
Total CK: 47
Total CK: 52
Total CK: 65

## 2010-12-20 LAB — LIPID PANEL
Cholesterol: 178
LDL Cholesterol: 83
LDL Cholesterol: 89
Total CHOL/HDL Ratio: 6
Triglycerides: 300 — ABNORMAL HIGH
VLDL: 47 — ABNORMAL HIGH
VLDL: 60 — ABNORMAL HIGH

## 2010-12-20 LAB — URINE CULTURE
Colony Count: NO GROWTH
Special Requests: NEGATIVE

## 2010-12-20 LAB — MAGNESIUM: Magnesium: 1.8

## 2010-12-20 LAB — HEMOGLOBIN A1C
Hgb A1c MFr Bld: 5.7
Mean Plasma Glucose: 126

## 2010-12-20 LAB — AFB CULTURE WITH SMEAR (NOT AT ARMC): Acid Fast Smear: NONE SEEN

## 2010-12-20 LAB — TSH: TSH: 2.883

## 2010-12-20 LAB — ANTI-NEUTROPHIL ANTIBODY: Cytoplasmic Neutrophilic Ab: <1:20 {titer}

## 2010-12-20 LAB — CULTURE, BLOOD (ROUTINE X 2): Culture: NO GROWTH

## 2010-12-20 LAB — PROTIME-INR
INR: 1
INR: 1.1
INR: 1.1
Prothrombin Time: 14.5
Prothrombin Time: 14.1

## 2010-12-20 LAB — POCT CARDIAC MARKERS
Operator id: 277751
Troponin i, poc: <0.05

## 2010-12-20 LAB — SEDIMENTATION RATE
Sed Rate: 106 — ABNORMAL HIGH
Sed Rate: 96 — ABNORMAL HIGH

## 2010-12-20 LAB — C-REACTIVE PROTEIN: CRP: 18.8 — ABNORMAL HIGH (ref <0.6)

## 2010-12-20 LAB — POCT I-STAT CREATININE
Creatinine, Ser: 1
Creatinine, Ser: 1.2
Operator id: 284251
Operator id: 261381

## 2010-12-20 LAB — TROPONIN I
Troponin I: 0.06
Troponin I: 0.07 — ABNORMAL HIGH

## 2010-12-20 LAB — CARDIAC PANEL(CRET KIN+CKTOT+MB+TROPI)
CK, MB: 36 — ABNORMAL HIGH
Relative Index: 4 — ABNORMAL HIGH
Total CK: 2914 — ABNORMAL HIGH
Troponin I: 26.67
Troponin I: 32.3

## 2010-12-20 LAB — APTT
aPTT: 25
aPTT: 30
aPTT: 35

## 2010-12-20 LAB — D-DIMER, QUANTITATIVE: D-Dimer, Quant: 0.66 — ABNORMAL HIGH

## 2011-01-01 ENCOUNTER — Inpatient Hospital Stay (HOSPITAL_COMMUNITY)
Admission: EM | Admit: 2011-01-01 | Discharge: 2011-01-05 | DRG: 440 | Disposition: A | Discharge status: Home / Self Care | Payer: Medicare Other | Attending: Internal Medicine | Admitting: Internal Medicine

## 2011-01-01 ENCOUNTER — Emergency Department (HOSPITAL_COMMUNITY): Payer: Medicare Other

## 2011-01-01 DIAGNOSIS — Z85038 Personal history of other malignant neoplasm of large intestine: Secondary | ICD-10-CM

## 2011-01-01 DIAGNOSIS — E781 Pure hyperglyceridemia: Secondary | ICD-10-CM | POA: Diagnosis present

## 2011-01-01 DIAGNOSIS — K219 Gastro-esophageal reflux disease without esophagitis: Secondary | ICD-10-CM | POA: Diagnosis present

## 2011-01-01 DIAGNOSIS — I252 Old myocardial infarction: Secondary | ICD-10-CM

## 2011-01-01 DIAGNOSIS — K859 Acute pancreatitis without necrosis or infection, unspecified: Principal | ICD-10-CM | POA: Diagnosis present

## 2011-01-01 DIAGNOSIS — I10 Essential (primary) hypertension: Secondary | ICD-10-CM | POA: Diagnosis present

## 2011-01-01 DIAGNOSIS — Z8546 Personal history of malignant neoplasm of prostate: Secondary | ICD-10-CM

## 2011-01-01 DIAGNOSIS — F329 Major depressive disorder, single episode, unspecified: Secondary | ICD-10-CM | POA: Diagnosis present

## 2011-01-01 DIAGNOSIS — F3289 Other specified depressive episodes: Secondary | ICD-10-CM | POA: Diagnosis present

## 2011-01-01 LAB — LIPASE, BLOOD: Lipase: 979 U/L — ABNORMAL HIGH (ref 11–59)

## 2011-01-01 LAB — COMPREHENSIVE METABOLIC PANEL
ALT: 33 U/L (ref 0–53)
AST: 20 U/L (ref 0–37)
Albumin: 3.9 g/dL (ref 3.5–5.2)
Chloride: 97 mEq/L (ref 96–112)
Creatinine, Ser: 0.91 mg/dL (ref 0.50–1.35)
Potassium: 3.9 mEq/L (ref 3.5–5.1)
Sodium: 131 mEq/L — ABNORMAL LOW (ref 135–145)
Total Bilirubin: 0.4 mg/dL (ref 0.3–1.2)

## 2011-01-01 LAB — DIFFERENTIAL
Basophils Absolute: 0 10*3/uL (ref 0.0–0.1)
Basophils Relative: 0 % (ref 0–1)
Eosinophils Absolute: 0.1 10*3/uL (ref 0.0–0.7)
Eosinophils Relative: 2 % (ref 0–5)
Neutrophils Relative %: 77 % (ref 43–77)

## 2011-01-01 LAB — CBC
MCV: 88.8 fL (ref 78.0–100.0)
Platelets: 200 10*3/uL (ref 150–400)
RDW: 12.5 % (ref 11.5–15.5)
WBC: 9.7 10*3/uL (ref 4.0–10.5)

## 2011-01-01 LAB — URINALYSIS, ROUTINE W REFLEX MICROSCOPIC
Glucose, UA: NEGATIVE mg/dL
Hgb urine dipstick: NEGATIVE
Protein, ur: NEGATIVE mg/dL

## 2011-01-01 MED ORDER — IOHEXOL 300 MG/ML  SOLN
125.0000 mL | Freq: Once | INTRAMUSCULAR | Status: AC | PRN
  Administered 2011-01-01: 125 mL via INTRAVENOUS

## 2011-01-01 NOTE — H&P (Signed)
Bruce Winters, Bruce Winters            ACCOUNT NO.:  [id number]  MEDICAL RECORD NO.:  [id number]  LOCATION:  WLED                         FACILITY:  [facility]  PHYSICIAN:  Ladell Pier, M.D.   DATE OF BIRTH:  26-Sep-1945  DATE OF ADMISSION:  01/01/2011 DATE OF DISCHARGE:                             HISTORY & PHYSICAL   CHIEF COMPLAINT:  Abdominal pain.  HISTORY OF PRESENT ILLNESS:  The patient is a 65 year old white male, past medical history significant for coronary artery disease.  Also he has hypertension, GERD, hyperlipidemia, and depression.  The patient stated this starting yesterday which is 1 day ago he started having severe abdominal pain that goes across his abdomen and in the periumbilical region.  This gotten worse over the past day.  No nausea, no vomiting.  He does feel like he was having some chills.  No diarrhea.  No alcohol use.   PAST MEDICAL HISTORY:  Significant for: 1. 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  Hospital course complicated by ARDS. 2. Prostate cancer, status post prostate surgery. 3. Colon cancer, status post surgery. 4. Hypertension. 5. Hyperlipidemia. 6. GERD. 7. Depression.  FAMILY HISTORY:  Father died at 59 from old age.  He also had Alzheimer dementia and heart disease.  Mother is alive.  She has heart disease. She is in her 70s.  SOCIAL HISTORY:  The patient does not smoke, does not drink alcohol.  He is divorced.  He has four children.  He is a retired.  He was a [employer] architect.  MEDICATIONS: 1. Aspirin 325 mg daily and then p.r.n. 2. Niacin SR 500 mg daily. 3. Vitamin D3 2000 units daily. 4. Metoprolol 50 mg half a tablet twice daily. 5. Citalopram 40 mg half a tablet in the evening. 6. Ranitidine 150 mg twice daily. 7. Omeprazole 20 mg 2 capsules in the morning. 8. Fenofibrate 160 mg every morning. 9. Crestor 40 mg half a tablet at bedtime. 10.Multivitamin daily.  ALLERGIES:   PENICILLIN.  REVIEW OF SYSTEMS:  Negative, otherwise stated in the HPI.  PHYSICAL EXAMINATION:  VITAL SIGNS:  Temperature 98.8, pulse 69, respirations 16, blood pressure 144/84, and pulse ox 100% on room air. GENERAL:  The patient is sitting up on stretcher, well-nourished white male. HEENT:  Head is normocephalic, atraumatic.  His pupils are reactive to light without erythema. CARDIOVASCULAR:  Regular rate and rhythm. LUNGS:  Clear bilaterally. ABDOMEN:  Soft.  Mid abdominal scar secondary to surgery.  Positive bowel sounds.  Some tenderness in the right upper quadrant, but no guarding nor rebound. EXTREMITIES:  Trace edema bilaterally, 2+ DP pulse bilaterally.  CT scan of the abdomen and pelvis showed pancreatitis involving the head of the pancreas.  No evidence for pancreatic necrosis or pseudocyst formation.  Pulmonary nodule similar to x-ray of 2009.  UA negative. Sodium 131, potassium 3.9, chloride 97, CO2 of 26, glucose 101, BUN 17, creatinine 0.91, AST 20, ALT 33, and alk phos 84.  WBC 9.7, hemoglobin 13.3, MCV 88.8, and platelet of 200.  ASSESSMENT/PLAN:  Acute pancreatitis question of the etiology.  He does not drink alcohol.  His liver function  tests are normal.  Per [facility], statins and also niacin can cause acute pancreatitis, so I did stop those medications while the workup is in progress.  Continued his other home medication, put him on bowel rest with only sips and ice chips.  Get ultrasound of the abdomen to better evaluate the ducts and continue his other home medications.  Time spent with the patient and doing this admission is approximately 45 minutes.     Ladell Pier, M.D.     [place]/MEDQ  D:  01/01/2011  T:  01/01/2011  Job:  [id number]  Electronically Signed by Ladell Pier M.D. on 01/01/2011 09:48:25 PM

## 2011-01-02 ENCOUNTER — Inpatient Hospital Stay (HOSPITAL_COMMUNITY): Payer: Medicare Other

## 2011-01-02 LAB — BASIC METABOLIC PANEL
BUN: 13 mg/dL (ref 6–23)
Chloride: 95 mEq/L — ABNORMAL LOW (ref 96–112)
Glucose, Bld: 112 mg/dL — ABNORMAL HIGH (ref 70–99)
Potassium: 4 mEq/L (ref 3.5–5.1)

## 2011-01-02 LAB — CBC
HCT: 37.5 % — ABNORMAL LOW (ref 39.0–52.0)
Hemoglobin: 13 g/dL (ref 13.0–17.0)
MCHC: 34.7 g/dL (ref 30.0–36.0)
WBC: 10 10*3/uL (ref 4.0–10.5)

## 2011-01-02 LAB — LIPID PANEL
HDL: 37 mg/dL — ABNORMAL LOW (ref >39)
LDL Cholesterol: 127 mg/dL — ABNORMAL HIGH (ref 0–99)
Total CHOL/HDL Ratio: 5.3 RATIO
Triglycerides: 157 mg/dL — ABNORMAL HIGH (ref <150)
VLDL: 31 mg/dL (ref 0–40)

## 2011-01-03 LAB — CBC
MCV: 92 fL (ref 78.0–100.0)
Platelets: 203 10*3/uL (ref 150–400)
RDW: 12.5 % (ref 11.5–15.5)
WBC: 10.5 10*3/uL (ref 4.0–10.5)

## 2011-01-03 LAB — COMPREHENSIVE METABOLIC PANEL
AST: 141 U/L — ABNORMAL HIGH (ref 0–37)
Albumin: 3.5 g/dL (ref 3.5–5.2)
Chloride: 95 mEq/L — ABNORMAL LOW (ref 96–112)
Creatinine, Ser: 0.78 mg/dL (ref 0.50–1.35)
Potassium: 3.9 mEq/L (ref 3.5–5.1)
Total Bilirubin: 1.1 mg/dL (ref 0.3–1.2)
Total Protein: 7.3 g/dL (ref 6.0–8.3)

## 2011-01-03 LAB — DIFFERENTIAL
Basophils Absolute: 0 10*3/uL (ref 0.0–0.1)
Eosinophils Absolute: 0.1 10*3/uL (ref 0.0–0.7)
Eosinophils Relative: 1 % (ref 0–5)

## 2011-01-03 LAB — MAGNESIUM: Magnesium: 2.1 mg/dL (ref 1.5–2.5)

## 2011-01-04 LAB — BASIC METABOLIC PANEL
CO2: 29 mEq/L (ref 19–32)
Calcium: 9 mg/dL (ref 8.4–10.5)
Chloride: 94 mEq/L — ABNORMAL LOW (ref 96–112)
Potassium: 3.9 mEq/L (ref 3.5–5.1)
Sodium: 130 mEq/L — ABNORMAL LOW (ref 135–145)

## 2011-01-04 LAB — CBC
Hemoglobin: 11.5 g/dL — ABNORMAL LOW (ref 13.0–17.0)
MCH: 31 pg (ref 26.0–34.0)
Platelets: 184 10*3/uL (ref 150–400)
RBC: 3.71 MIL/uL — ABNORMAL LOW (ref 4.22–5.81)
WBC: 8.4 10*3/uL (ref 4.0–10.5)

## 2011-01-04 LAB — LIPASE, BLOOD: Lipase: 77 U/L — ABNORMAL HIGH (ref 11–59)

## 2011-01-04 NOTE — Discharge Summary (Signed)
Bruce Winters, Bruce Winters NO.:  [id number]  MEDICAL RECORD NO.:  [id number]  LOCATION:  1402                         FACILITY:  [facility]  PHYSICIAN:  Talmage Nap, MD  DATE OF BIRTH:  [birth year]  DATE OF ADMISSION:  01/01/2011 DATE OF DISCHARGE:  01/05/2011                        DISCHARGE SUMMARY - REFERRING   PRIMARY CARE PHYSICIAN:  Unknown.  DISCHARGE DIAGNOSES: 1. Acute pancreatitis, most likely secondary to hypertriglyceridemia. 2. Hypertriglyceridemia. 3. Hypertension. 4. Gastroesophageal reflux disease. 5. History of depression. 6. History of ST-segment elevation myocardial infarction, complicated     by ventricular arrest. 7. History of prostate and colon cancer. The patient is a 65 year old Caucasian male with history of coronary artery disease and other medical problems was admitted to the hospital on January 01, 2011 by Dr. Ladell Pier with complaints of abdominal pain, which was said to be generalized, but mainly periumbilical.  There was no associated history of nausea or vomiting.  No fever.  No chills. No rigor.  The patient also denies any history of alcohol ingestion. Pain was said to have persisted, hence the patient presented to the hospital to be evaluated.  PREADMISSION MEDICATIONS:  Include: 1. Aspirin 325 mg p.o. daily p.r.n. 2. Niacin SR 500 mg p.o. daily. 3. Vitamin D3 2000 units p.o. daily. 4. Metoprolol 50 mg half a tablet p.o. b.i.d. 5. Citalopram 40 mg half a tablet p.o. q.a.m. 6. Ranitidine 150 mg p.o. b.i.d. 7. Omeprazole 20 mg two tablets p.o. q.a.m. 8. Fenofibrate 160 mg p.o. daily. 9. Crestor 40 mg p.o. daily at bedtime. 10.Multivitamin one p.o. daily.  ALLERGIES:  PENICILLIN.  SOCIAL HISTORY:  Negative for alcohol or tobacco use.  The patient is retired, divorced, and he is a Naval architect.  FAMILY HISTORY:  Father died at the age of 85 from complication of Alzheimer as well as coronary artery disease.  Mother  is alive and she is said to have coronary artery disease.  REVIEW OF SYSTEMS:  Essentially documented in the initial history and physical.  PHYSICAL EXAMINATION:  At time the patient was seen by the admitting physician, VITAL SIGNS:  Temperature is 98.8, pulse 69, respiratory rate 16, blood pressure is 144/84, pulse oximetry is 100% on room air. HEENT:  Pupils are reactive to light and extraocular muscles are intact. NECK:  No jugular venous distention.  No carotid bruit.  No lymphadenopathy. CHEST:  Clear to auscultation. HEART:  Sounds are 1 and 2. ABDOMEN:  Soft with tenderness in the right upper quadrant.  No guarding.  No rigidity.  Liver and spleen tip not palpable.  Bowel sounds are positive. EXTREMITIES:  Show trace edema. NEUROLOGIC EXAM:  Nonfocal. MUSCULOSKELETAL SYSTEM:  Unremarkable. SKIN:  Normal turgor.  LABORATORY DATA:  Initial complete blood count with differential showed WBC of 9.7, hemoglobin of 13.3, hematocrit of 37.3, MCV of 88.8, platelet count of 200, normal differentials.  Comprehensive metabolic panel showed sodium of 131, potassium of 3.9, chloride of 97 with a bicarb of 26. Glucose is 101, BUN is 17, creatinine is 0.91.  Urinalysis unremarkable.  Lipase level is 979.  Fasting lipid profile done showed total cholesterol of 195, triglycerides 157, HDL cholesterol 37, LDL cholesterol 127, and VLDL cholesterol  of 31.  Subsequent repeat of lipase levels are as follows:  77, 111 and 77.  A repeat complete blood count with no differential done on January 04, 2011 showed WBC of 8.4, hemoglobin of 11.5, hematocrit of 33.5, MCV of 90.3 with a platelet count over 184.  Basic metabolic panel showed sodium of 130, potassium of 3.9, chloride of 94 with a bicarb of 29.  Glucose is 114, BUN is 11, creatinine 2.76.  Lipase done on January 04, 2011 is 32.  IMAGING STUDIES:  Done include CT of the abdomen and pelvis with contrast and it showed findings consistent with  pancreatitis involving the head of the pancreas.  There is no evidence of pancreatic necrosis or pseudocyst formation.  An ultrasound of the abdomen showed inflammatory changes within the pancreas.  There is no peripancreatic fluid collection and the patient is status post cholecystectomy.  HOSPITAL COURSE:  The patient was admitted to general medical floor.  He was made n.p.o., started on normal saline to go at rate of 40 cc an hour, Zofran for nausea as well as Phenergan and Lovenox 40 mg subcutaneous q.24 for DVT prophylaxis.  Pain control was done with Dilaudid 0.5-1 mg IV q.4 p.r.n.  The patient was seen by me for the very first time in this admission on January 02, 2011 and during this encounter, the patient still complained about mild abdominal discomfort. Examination was unremarkable.  At this time, the patient was to continue n.p.o. except medications and gemfibrozil 600 mg p.o. b.i.d. was added to regimen.  On January 03, 2011, he was started on clear liquids and this was advanced as tolerated.  So far, the patient has made remarkable progress.  The abdominal pain almost completely resolved and lipase level is on the downward trend.  He was seen by me today, feels better, denied any specific complaint.  Examination of the patient was essentially unremarkable.  His vital signs; blood pressure is 114/71, temperature is 97.6, pulse 80, respiratory rate is 20, medically stable. Plan is to advance patient's diet as tolerated.  CURRENT MEDICATIONS:  Include the following: 1. Aspirin 325 mg p.o. daily. 2. Vitamin D3 2000 units p.o. daily. 3. Citalopram 20 mg p.o. q.h.s. 4. Lovenox 50 mg subcutaneous q.24. 5. Gemfibrozil 200 mg p.o. b.i.d. 6. Metoprolol 25 mg p.o. b.i.d. 7. Multivitamin one tablet p.o. daily. 8. Protonix 40 mg p.o. q.12. 9. Dilaudid 0.5-1 mg IV q.4 p.r.n. 10.Zofran 4 mg IV q.6 p.r.n. 11.Phenergan 12.5 mg IV q.6 p.r.n.  The patient will be followed and evaluated  on a daily basis.  Time of discharge will be determined by the rounding physician and subsequently discharge medication will be dictated.     Talmage Nap, MD     CN/MEDQ  D:  01/04/2011  T:  01/04/2011  Job:  [id number]  Electronically Signed by Talmage Nap  on 01/04/2011 05:43:02 PM

## 2012-04-03 ENCOUNTER — Other Ambulatory Visit: Payer: Self-pay | Admitting: Cardiology

## 2012-04-03 ENCOUNTER — Ambulatory Visit
Admission: RE | Admit: 2012-04-03 | Discharge: 2012-04-03 | Disposition: A | Discharge status: Home / Self Care | Payer: Medicare Other | Source: Ambulatory Visit | Attending: Cardiology | Admitting: Cardiology

## 2012-04-03 DIAGNOSIS — R0789 Other chest pain: Secondary | ICD-10-CM

## 2013-11-29 ENCOUNTER — Ambulatory Visit
Admission: RE | Admit: 2013-11-29 | Discharge: 2013-11-29 | Disposition: A | Discharge status: Home / Self Care | Payer: Medicare Other | Source: Ambulatory Visit | Attending: Cardiology | Admitting: Cardiology

## 2013-11-29 ENCOUNTER — Other Ambulatory Visit: Payer: Self-pay | Admitting: Cardiology

## 2013-11-29 DIAGNOSIS — J8410 Pulmonary fibrosis, unspecified: Secondary | ICD-10-CM

## 2014-12-18 ENCOUNTER — Ambulatory Visit (INDEPENDENT_AMBULATORY_CARE_PROVIDER_SITE_OTHER): Payer: Medicare HMO | Admitting: Internal Medicine

## 2014-12-18 ENCOUNTER — Encounter: Payer: Self-pay | Admitting: Internal Medicine

## 2014-12-18 VITALS — BP 140/84 | HR 91 | Temp 98.1°F | Ht 73.0 in | Wt 262.8 lb

## 2014-12-18 DIAGNOSIS — J8410 Pulmonary fibrosis, unspecified: Secondary | ICD-10-CM

## 2014-12-18 DIAGNOSIS — I10 Essential (primary) hypertension: Secondary | ICD-10-CM

## 2014-12-18 DIAGNOSIS — R058 Other specified cough: Secondary | ICD-10-CM

## 2014-12-18 DIAGNOSIS — E669 Obesity, unspecified: Secondary | ICD-10-CM

## 2014-12-18 DIAGNOSIS — R05 Cough: Secondary | ICD-10-CM | POA: Diagnosis not present

## 2014-12-18 DIAGNOSIS — R059 Cough, unspecified: Secondary | ICD-10-CM

## 2014-12-18 MED ORDER — AZITHROMYCIN 250 MG PO TABS: ORAL_TABLET | ORAL | Status: DC

## 2014-12-18 MED ORDER — TRAMADOL HCL 50 MG PO TABS: ORAL_TABLET | ORAL | Status: DC

## 2014-12-18 MED ORDER — VALSARTAN 160 MG PO TABS: 160.0000 mg | ORAL_TABLET | Freq: Every day | ORAL | Status: DC

## 2014-12-18 MED ORDER — PREDNISONE 10 MG PO TABS: ORAL_TABLET | ORAL | Status: DC

## 2014-12-18 MED ORDER — FAMOTIDINE 20 MG PO TABS: ORAL_TABLET | ORAL | Status: DC

## 2014-12-18 NOTE — Patient Instructions (Addendum)
zpak   Stop cozar and advair   Start diovan 160 mg one daily   The key to effective treatment for your cough is eliminating the non-stop cycle of cough you're stuck in long enough to let your airway heal completely and then see if there is anything still making you cough once you stop the cough suppression, but this should take no more than 5 days to figure out  First take delsym two tsp every 12 hours and supplement if needed with  tramadol 50 mg up to 2 every 4 hours to suppress the urge to cough at all or even clear your throat. Swallowing water or using ice chips/non mint and menthol containing candies (such as lifesavers or sugarless jolly ranchers) are also effective.  You should rest your voice and avoid activities that you know make you cough.  Once you have eliminated the cough for 3 straight days try reducing the tramadol first,  then the delsym as tolerated.    For drainage / throat tickle try take CHLORPHENIRAMINE  4 mg - take one every 4 hours as needed - available over the counter- may cause drowsiness so start with just a bedtime dose or two and see how you tolerate it before trying in daytime    Prednisone 10 mg take  4 each am x 2 days,   2 each am x 2 days,  1 each am x 2 days and stop (this is to eliminate allergies and inflammation from coughing)  Protonix (pantoprazole) Take 30-60 min before first meal of the day and Pepcid 20 mg one bedtime plus chlorpheniramine 4 mg x 2 at bedtime (both available over the counter)  until cough is completely gone for at least a week without the need for cough suppression  GERD (REFLUX)  is an extremely common cause of respiratory symptoms, many times with no significant heartburn at all.    It can be treated with medication, but also with lifestyle changes including avoidance of late meals, excessive alcohol, smoking cessation, and avoid fatty foods, chocolate, peppermint, colas, red wine, and acidic juices such as orange juice.  NO MINT OR  MENTHOL PRODUCTS SO NO COUGH DROPS  USE HARD CANDY INSTEAD (jolley ranchers or Stover's or Lifesavers (all available in sugarless versions) NO OIL BASED VITAMINS - use powdered substitutes.  Please schedule a follow up office visit in 2 weeks, sooner if needed  Late add needs cxr on return

## 2014-12-18 NOTE — Progress Notes (Signed)
   Subjective:    Patient ID: Bruce Winters, male    DOB: [birth year], 69 y.o.   MRN: [id number]  HPI  25 yowm quit smoking around 1990's prev eval for Cough in pulmonary clinic by [facility] dx as uacs/gerd/ACEi intol referred back to pulmonary clinic by Dr [name] for refractory cough since first of sept 2016  12/18/2014 1st [facility] office visit/ [facility]/ [facility]   Chief Complaint  Patient presents with  . Pulmonary Consult    Referred by Dr. [name]. Pt c/o cough for the past 3 wks- prod with grey sputum. He also c/o DOE "for years" progressively worse.   onset was acute "like a chest cold" assoc with nasal congestion / drainage but gradually evolved to a mostly day time dry  hacking brought on by talking worse on  advair and assoc with mild sob when cough is bad but not so much if not coughing   No obvious other patterns in day to day or daytime variabilty or assoc   cp or chest tightness, subjective wheeze overt sinus or hb symptoms. No unusual exp hx or h/o childhood pna/ asthma or knowledge of premature birth.  Sleeping ok without nocturnal  or early am exacerbation  of respiratory  c/o's or need for noct saba. Also denies any obvious fluctuation of symptoms with weather or environmental changes or other aggravating or alleviating factors except as outlined above   Current Medications, Allergies, Complete Past Medical History, Past Surgical History, Family History, and Social History were reviewed in [employer] record.               Review of Systems  Constitutional: Negative for fever, chills, activity change, appetite change and unexpected weight change.  HENT: Positive for congestion. Negative for dental problem, postnasal drip, rhinorrhea, sneezing, sore throat, trouble swallowing and voice change.   Eyes: Negative for visual disturbance.  Respiratory: Positive for cough and shortness of breath. Negative for choking.   Cardiovascular: Positive for  chest pain. Negative for leg swelling.  Gastrointestinal: Negative for nausea, vomiting and abdominal pain.  Genitourinary: Negative for difficulty urinating.  Musculoskeletal: Negative for arthralgias.  Skin: Negative for rash.  Psychiatric/Behavioral: Negative for behavioral problems and confusion.       Objective:   Physical Exam  amb obese wm  very haorse wm harsh barking upper airway cough  Wt Readings from Last 3 Encounters:  12/18/14 262 lb 12.8 oz (119.205 kg)  08/21/07 244 lb 6.1 oz (110.851 kg)  07/12/07 236 lb (107.049 kg)    Vital signs reviewed  HEENT:top dentures/ o/w nl lower  dentition, turbinates, and orophanx. Nl external ear canals without cough reflex   NECK :  without JVD/Nodes/TM/ nl carotid upstrokes bilaterally   LUNGS: no acc muscle use, clear to A and P bilaterally without cough on insp or exp maneuvers   CV:  RRR  no s3 or murmur or increase in P2, no edema   ABD:  soft and nontender with nl excursion in the supine position. No bruits or organomegaly, bowel sounds nl  MS:  warm without deformities, calf tenderness, cyanosis or clubbing  SKIN: warm and dry without lesions    NEURO:  alert, approp, no deficits   No recent cxr on file         Assessment & Plan:

## 2014-12-19 ENCOUNTER — Encounter: Payer: Self-pay | Admitting: Internal Medicine

## 2014-12-19 DIAGNOSIS — I10 Essential (primary) hypertension: Secondary | ICD-10-CM | POA: Insufficient documentation

## 2014-12-19 NOTE — Assessment & Plan Note (Addendum)
eval 2009 by [name] resp to gerd rx - recurred around 1st September 2016  .The most common causes of chronic cough in immunocompetent adults include the following: upper airway cough syndrome (UACS), previously referred to as postnasal drip syndrome (PNDS), which is caused by variety of rhinosinus conditions; (2) asthma; (3) GERD; (4) chronic bronchitis from cigarette smoking or other inhaled environmental irritants; (5) nonasthmatic eosinophilic bronchitis; and (6) bronchiectasis.   These conditions, singly or in combination, have accounted for up to 94% of the causes of chronic cough in prospective studies.   Other conditions have constituted no >6% of the causes in prospective studies These have included bronchogenic carcinoma, chronic interstitial pneumonia, sarcoidosis, left ventricular failure, ACEI-induced cough, and aspiration from a condition associated with pharyngeal dysfunction.    Chronic cough is often simultaneously caused by more than one condition. A single cause has been found from 38 to 82% of the time, multiple causes from 18 to 62%. Multiply caused cough has been the result of three diseases up to 42% of the time.       Based on hx and exam, this is most likely:  Classic Upper airway cough syndrome, so named because it's frequently impossible to sort out how much is  CR/sinusitis with freq throat clearing (which can be related to primary GERD)   vs  causing  secondary (" extra esophageal")  GERD from wide swings in gastric pressure that occur with throat clearing, often  promoting self use of mint and menthol lozenges that reduce the lower esophageal sphincter tone and exacerbate the problem further in a cyclical fashion.   These are the same pts (now being labeled as having "irritable larynx syndrome" by some cough centers) who not infrequently have a history of having failed to tolerate ace inhibitors(and reported from cozar also, see hbp),   dry powder inhalers or biphosphonates  or report having atypical reflux symptoms that don't respond to standard doses of PPI , and are easily confused as having aecopd or asthma flares by even experienced allergists/ pulmonologists.   The first step is to maximize acid suppression and eliminate advair/ cyclical coughing/ change cozar to diovan,  then regroup in two weeks   I had an extended discussion with the patient reviewing all relevant studies completed to date and  lasting 35 min   .   Each maintenance medication was reviewed in detail including most importantly the difference between maintenance and prns and under what circumstances the prns are to be triggered using an action plan format that is not reflected in the computer generated alphabetically organized AVS.    Please see instructions for details which were reviewed in writing and the patient given a copy highlighting the part that I personally wrote and discussed at today's ov.   See instructions for specific recommendations which were reviewed directly with the patient who was given a copy with highlighter outlining the key components.

## 2014-12-19 NOTE — Assessment & Plan Note (Signed)
First reported on CT 05/2007   His cough is not at all c/w PF but he will need serial f/u established to see if there has been progression in the last 7 years and will start the w/u once we have the cough controlled better  Worth noting: Use of PPI is associated with improved survival time and with decreased radiologic fibrosis per King's study published in [facility] vol 184 p1390.  Dec 2011 and also may have other beneficial effects as per the latest review in [facility] vol 193 p1345 Jun 20016.  This may not always be cause and effect, but given how universally unimpressive and expensive  all the other  Drugs developed to day  have been for pf,   rec start  rx ppi / diet/ lifestyle modification and f/u with serial walking sats and lung volumes for now to put more points on the curve / establish firm baseline before considering additional measures.

## 2014-12-19 NOTE — Assessment & Plan Note (Signed)
For reasons that may related to vascular permability and nitric oxide pathways but not elevated  bradykinin levels (as seen with  ACEi use) losartan in the generic form has been reported now from mulitple sources  to cause a similar pattern of non-specific  upper airway symptoms as seen with acei.   This has not been reported with exposure to the other ARB's to date, so it seems reasonable for now to try either generic diovan or avapro if ARB needed or use an alternative class altogether.  See:  [facility] Allergy Asthma Immunol  2008: 101: p 495-499   Try diovan 160 mg daily  

## 2014-12-19 NOTE — Assessment & Plan Note (Signed)
Body mass index is 34.68    Lab Results  Component Value Date   TSH 0.803   05/19/2007     Contributing to gerd tendency/ doe/reviewed the need and the process to achieve and maintain neg calorie balance > defer f/u primary care including intermittently monitoring thyroid status

## 2015-01-01 ENCOUNTER — Ambulatory Visit (INDEPENDENT_AMBULATORY_CARE_PROVIDER_SITE_OTHER): Payer: Medicare HMO | Admitting: Internal Medicine

## 2015-01-01 ENCOUNTER — Encounter: Payer: Self-pay | Admitting: Internal Medicine

## 2015-01-01 VITALS — BP 138/82 | HR 100 | Ht 73.0 in | Wt 259.8 lb

## 2015-01-01 DIAGNOSIS — R05 Cough: Secondary | ICD-10-CM | POA: Diagnosis not present

## 2015-01-01 DIAGNOSIS — J8410 Pulmonary fibrosis, unspecified: Secondary | ICD-10-CM | POA: Diagnosis not present

## 2015-01-01 DIAGNOSIS — I10 Essential (primary) hypertension: Secondary | ICD-10-CM | POA: Diagnosis not present

## 2015-01-01 DIAGNOSIS — R059 Cough, unspecified: Secondary | ICD-10-CM

## 2015-01-01 DIAGNOSIS — R058 Other specified cough: Secondary | ICD-10-CM

## 2015-01-01 MED ORDER — FAMOTIDINE 20 MG PO TABS: ORAL_TABLET | ORAL | Status: DC

## 2015-01-01 MED ORDER — PREDNISONE 10 MG PO TABS: ORAL_TABLET | ORAL | Status: DC

## 2015-01-01 MED ORDER — TRAMADOL HCL 50 MG PO TABS: ORAL_TABLET | ORAL | Status: DC

## 2015-01-01 NOTE — Patient Instructions (Addendum)
Start diovan 160 mg one daily   The key to effective treatment for your cough is eliminating the non-stop cycle of cough you're stuck in long enough to let your airway heal completely and then see if there is anything still making you cough once you stop the cough suppression, but this should take no more than 5 days to figure out  First take delsym two tsp every 12 hours and supplement if needed with  tramadol 50 mg up to 2 every 4 hours to suppress the urge to cough at all or even clear your throat. Swallowing water or using ice chips/non mint and menthol containing candies (such as lifesavers or sugarless jolly ranchers) are also effective.  You should rest your voice and avoid activities that you know make you cough.  Once you have eliminated the cough for 3 straight days try reducing the tramadol first,  then the delsym as tolerated.    For drainage / throat tickle try take CHLORPHENIRAMINE  4 mg - take one every 4 hours as needed - available over the counter- may cause drowsiness so start with just a bedtime dose or two and see how you tolerate it before trying in daytime    Prednisone 10 mg take  4 each am x 2 days,   2 each am x 2 days,  1 each am x 2 days and stop (this is to eliminate allergies and inflammation from coughing)  Omeprazole 20 mg x 2 Take 30-60 min before first meal of the day and Pepcid 20 mg one bedtime   For drainage / throat tickle try take CHLORPHENIRAMINE  4 mg - take one every 4 hours as needed - available over the counter- may cause drowsiness so start with just a bedtime dose or two and see how you tolerate it before trying in daytime    GERD (REFLUX)  is an extremely common cause of respiratory symptoms, many times with no significant heartburn at all.    It can be treated with medication, but also with lifestyle changes including avoidance of late meals, excessive alcohol, smoking cessation, and avoid fatty foods, chocolate, peppermint, colas, red wine, and  acidic juices such as orange juice.  NO MINT OR MENTHOL PRODUCTS SO NO COUGH DROPS  USE HARD CANDY INSTEAD (jolley ranchers or Stover's or Lifesavers (all available in sugarless versions) NO OIL BASED VITAMINS - use powdered substitutes.  See Tammy NP w/in 2 weeks with all your medications, even over the counter meds, separated in two separate bags, the ones you take no matter what vs the ones you stop once you feel better and take only as needed when you feel you need them.   Tammy  will generate for you a new user friendly medication calendar that will put [place] all on the same page re: your medication use.     Without this process, it simply isn't possible to assure that we are providing  your outpatient care  with  the attention to detail we feel you deserve.   If we cannot assure that you're getting that kind of care,  then we cannot manage your problem effectively from this clinic.  Once you have seen Tammy and we are sure that we're all on the same page with your medication use she will arrange follow up with Dr [name] Late add needs hrct and pfts on return

## 2015-01-01 NOTE — Progress Notes (Signed)
Subjective:    Patient ID: Bruce Winters, male    DOB: 1945-04-29, 69 y.o.   MRN: [id number]  Brief patient profile:  69 yowm quit smoking around 1990's prev eval for Cough in pulmonary clinic by [facility] dx as uacs/gerd/ACEi intol referred back to pulmonary clinic by Dr [name] for refractory cough since first of summer of 2016    History of Present Illness  12/18/2014 1st [place] [facility] office visit/ EP[facility]/ Bruce Winters   Chief Complaint  Patient presents with  . Pulmonary Consult    Referred by Dr. [name]. Pt c/o cough for the past 3 wks- prod with grey sputum. He also c/o DOE "for years" progressively worse.   onset was acute "like a chest cold" assoc with nasal congestion / drainage but gradually evolved to a mostly day time dry  hacking brought on by talking worse on  advair and assoc with mild sob when cough is bad but not so much if not coughing  rec zpak  Stop cozar and advair  Start diovan 160 mg one daily  The key to effective treatment for your cough is eliminating the non-stop cycle of cough you're stuck in long enough to let your airway heal completely and then see if there is anything still making you cough once you stop the cough suppression, but this should take no more than 5 days to figure out First take delsym two tsp every 12 hours and supplement if needed with  tramadol 50 mg up to 2 every 4 hours to suppress the urge to cough at all or even clear your throat. Swallowing water or using ice chips/non mint and menthol containing candies (such as lifesavers or sugarless jolly ranchers) are also effective.  You should rest your voice and avoid activities that you know make you cough. Once you have eliminated the cough for 3 straight days try reducing the tramadol first,  then the delsym as tolerated.   For drainage / throat tickle try take CHLORPHENIRAMINE  4 mg - take one every 4 hours as needed - available over the counter- may cause drowsiness so start with just a bedtime  dose or two and see how you tolerate it before trying in daytime   Prednisone 10 mg take  4 each am x 2 days,   2 each am x 2 days,  1 each am x 2 days and stop (this is to eliminate allergies and inflammation from coughing) Protonix (pantoprazole) Take 30-60 min before first meal of the day and Pepcid 20 mg one bedtime plus chlorpheniramine 4 mg x 2 at bedtime (both available over the counter)  until cough is completely gone for at least a week without the need for cough suppression GERD diet   01/01/2015  f/u ov/Bruce Winters re: cough since early 11/2014   Chief Complaint  Patient presents with  . Follow-up    Upper Airway Cough Syndrome. Pt states that his breathing has improved some. Pt c/o of chronic cough that is occasionally wet with yellow/creamy mucus. Pt c/o SOB regardless of exertion. Pt denies wheeze/tightness. Pt c/o of occasional chest pain on Left side/Left breast area. Pt's albuterol inhaler is empty.     Not clear he actually followed instructions were given and seems quite confused about these instructions and also his maintenance versus prns Has a recurrent pattern of left-sided chest discomfort during coughing fits only.  No obvious day to day or daytime variability or assoc     chest tightness, subjective  wheeze or overt sinus or hb symptoms. No unusual exp hx or h/o childhood pna/ asthma or knowledge of premature birth.  Sleeping ok without nocturnal  or early am exacerbation  of respiratory  c/o's or need for noct saba. Also denies any obvious fluctuation of symptoms with weather or environmental changes or other aggravating or alleviating factors except as outlined above   Current Medications, Allergies, Complete Past Medical History, Past Surgical History, Family History, and Social History were reviewed in [employer] record.  ROS  The following are not active complaints unless bolded sore throat, dysphagia, dental problems, itching, sneezing,  nasal  congestion or excess/ purulent secretions, ear ache,   fever, chills, sweats, unintended wt loss, classically pleuritic or exertional cp, hemoptysis,  orthopnea pnd or leg swelling, presyncope, palpitations, abdominal pain, anorexia, nausea, vomiting, diarrhea  or change in bowel or bladder habits, change in stools or urine, dysuria,hematuria,  rash, arthralgias, visual complaints, headache, numbness, weakness or ataxia or problems with walking or coordination,  change in mood/affect or memory.                            Objective:   Physical Exam  amb obese wm  Less hoarse wm harsh barking upper airway cough mint in mouth   Wt Readings from Last 3 Encounters:  12/18/14 262 lb 12.8 oz (119.205 kg)  08/21/07 244 lb 6.1 oz (110.851 kg)  07/12/07 236 lb (107.049 kg)    Vital signs reviewed  HEENT:top dentures/ o/w nl lower  dentition, turbinates, and orophanx. Nl external ear canals without cough reflex   NECK :  without JVD/Nodes/TM/ nl carotid upstrokes bilaterally   LUNGS: no acc muscle use, clear to A and P bilaterally without cough on insp or exp maneuvers   CV:  RRR  no s3 or murmur or increase in P2, no edema   ABD:  soft and nontender with nl excursion in the supine position. No bruits or organomegaly, bowel sounds nl  MS:  warm without deformities, calf tenderness, cyanosis or clubbing  SKIN: warm and dry without lesions    NEURO:  alert, approp, no deficits   cxr 11/25/14 report in care everywhere  C/w PF/ no acute changes           Assessment & Plan:

## 2015-01-04 NOTE — Assessment & Plan Note (Signed)
Complicated by  by hypertension and hyperlipidemia  Body mass index is 34.28    Lab Results  Component Value Date   TSH 0.803   05/19/2007     Contributing to gerd tendency/ doe/reviewed the need and the process to achieve and maintain neg calorie balance > defer f/u primary care including intermittently monitoring thyroid status

## 2015-01-04 NOTE — Assessment & Plan Note (Signed)
I still favor strongly upper airway cough syndrome over pulmonary fibrosis associated coughing at this point since the cough is not reproduced on inspiration.    Classic Upper airway cough syndrome, so named because it's frequently impossible to sort out how much is  CR/sinusitis with freq throat clearing (which can be related to primary GERD)   vs  causing  secondary (" extra esophageal")  GERD from wide swings in gastric pressure that occur with throat clearing, often  promoting self use of mint and menthol lozenges that reduce the lower esophageal sphincter tone and exacerbate the problem further in a cyclical fashion.   These are the same pts (now being labeled as having "irritable larynx syndrome" by some cough centers) who not infrequently have a history of having failed to tolerate ace inhibitors,  dry powder inhalers or biphosphonates or report having atypical reflux symptoms that don't respond to standard doses of PPI , and are easily confused as having aecopd or asthma flares by even experienced allergists/ pulmonologists.   Reviewed with Pt Unlike when you get a prescription for eyeglasses, it's not possible to always walk out of this or any medical office with a perfect prescription that is immediately effective  based on any test that we offer here.    On the contrary, it may take several weeks for the full impact of changes recommened today - hopefully you will respond well.  If not, then we'll adjust your medication on your next visit accordingly, knowing more then than we can possibly know now.   The next step in the workup is to repeat the first step but this time verify that he did it correctly before proceeding further  I had an extended discussion with the patient reviewing all relevant studies completed to date and  lasting 15 to 20 minutes of a 25 minute visit    Each maintenance medication was reviewed in detail including most importantly the difference between maintenance and  prns and under what circumstances the prns are to be triggered using an action plan format that is not reflected in the computer generated alphabetically organized AVS.    Please see instructions for details which were reviewed in writing and the patient given a copy highlighting the part that I personally wrote and discussed at today's ov.

## 2015-01-04 NOTE — Assessment & Plan Note (Signed)
First reported on CT 05/2007  - 01/01/2015  Walked RA x 3 laps @ 185 ft each stopped due to sob/ cough nl pace   Will need additional workup to include hrct/ pfts  once we have the cough under better control and also a better handle on exactly what medicines he is taking

## 2015-01-04 NOTE — Assessment & Plan Note (Signed)
Adequate control on present rx, reviewed > no change in rx needed  = diovan 160 mg daily  

## 2015-01-16 ENCOUNTER — Encounter: Payer: Self-pay | Admitting: Adult Health

## 2015-01-16 ENCOUNTER — Ambulatory Visit (INDEPENDENT_AMBULATORY_CARE_PROVIDER_SITE_OTHER): Payer: Medicare HMO | Admitting: Adult Health

## 2015-01-16 VITALS — BP 136/62 | HR 94 | Temp 98.4°F | Ht 74.0 in | Wt 262.2 lb

## 2015-01-16 DIAGNOSIS — R05 Cough: Secondary | ICD-10-CM

## 2015-01-16 DIAGNOSIS — R058 Other specified cough: Secondary | ICD-10-CM

## 2015-01-16 DIAGNOSIS — J8410 Pulmonary fibrosis, unspecified: Secondary | ICD-10-CM | POA: Diagnosis not present

## 2015-01-16 NOTE — Progress Notes (Signed)
Subjective:    Patient ID: Bruce Winters, male    DOB: 1945-11-04, 69 y.o.   MRN: [id number]  Brief patient profile:  69 yowm quit smoking around 1990's prev eval for Cough in pulmonary clinic by [facility] dx as uacs/gerd/ACEi intol referred back to pulmonary clinic by Dr [name] for refractory cough since first of summer of 2016    History of Present Illness  12/18/2014 1st [place] [facility] office visit/ [facility]/ Wert   Chief Complaint  Patient presents with  . Pulmonary Consult    Referred by Dr. [name]. Pt c/o cough for the past 3 wks- prod with grey sputum. He also c/o DOE "for years" progressively worse.   onset was acute "like a chest cold" assoc with nasal congestion / drainage but gradually evolved to a mostly day time dry  hacking brought on by talking worse on  advair and assoc with mild sob when cough is bad but not so much if not coughing  rec zpak  Stop cozar and advair  Start diovan 160 mg one daily  The key to effective treatment for your cough is eliminating the non-stop cycle of cough you're stuck in long enough to let your airway heal completely and then see if there is anything still making you cough once you stop the cough suppression, but this should take no more than 5 days to figure out First take delsym two tsp every 12 hours and supplement if needed with  tramadol 50 mg up to 2 every 4 hours to suppress the urge to cough at all or even clear your throat. Swallowing water or using ice chips/non mint and menthol containing candies (such as lifesavers or sugarless jolly ranchers) are also effective.  You should rest your voice and avoid activities that you know make you cough. Once you have eliminated the cough for 3 straight days try reducing the tramadol first,  then the delsym as tolerated.   For drainage / throat tickle try take CHLORPHENIRAMINE  4 mg - take one every 4 hours as needed - available over the counter- may cause drowsiness so start with just a bedtime  dose or two and see how you tolerate it before trying in daytime   Prednisone 10 mg take  4 each am x 2 days,   2 each am x 2 days,  1 each am x 2 days and stop (this is to eliminate allergies and inflammation from coughing) Protonix (pantoprazole) Take 30-60 min before first meal of the day and Pepcid 20 mg one bedtime plus chlorpheniramine 4 mg x 2 at bedtime (both available over the counter)  until cough is completely gone for at least a week without the need for cough suppression GERD diet   01/01/2015  f/u ov/Wert re: cough since early 11/2014   Chief Complaint  Patient presents with  . Follow-up    Upper Airway Cough Syndrome. Pt states that his breathing has improved some. Pt c/o of chronic cough that is occasionally wet with yellow/creamy mucus. Pt c/o SOB regardless of exertion. Pt denies wheeze/tightness. Pt c/o of occasional chest pain on Left side/Left breast area. Pt's albuterol inhaler is empty.     Not clear he actually followed instructions were given and seems quite confused about these instructions and also his maintenance versus prns Has a recurrent pattern of left-sided chest discomfort during coughing fits only. >pred taper   01/16/2015 Follow up : Chronic cough and Pulmonary Fibrosis  Pt returns for 2  week follow up .  Pt was seen for cough flare last visit for Dr. Sherene SiresWert  , he was treated with cough control regimen with delsym and tramadol . Started on PPI/pepcid and steroid taper .  He is feeling better . Cough is decreased but not resolved.  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  He is a former smoker  We discussed he will need PFT and a HRCT chest .  He is a former [employer] architecttruck driver. Unsure what he has been exposed to over the years. Was in the [employer] for 4 yrs.  No hx of MTX or amiodarone use.  No unusal hobbies.  He denies fever, hemoptysis , n/vd/ or chest pain.    Current Medications, Allergies, Complete Past Medical History, Past Surgical History,  Family History, and Social History were reviewed in [facility] electronic medical record.  ROS  The following are not active complaints unless bolded sore throat, dysphagia, dental problems, itching, sneezing,  nasal congestion or excess/ purulent secretions, ear ache,   fever, chills, sweats, unintended wt loss, classically pleuritic or exertional cp, hemoptysis,  orthopnea pnd or leg swelling, presyncope, palpitations, abdominal pain, anorexia, nausea, vomiting, diarrhea  or change in bowel or bladder habits, change in stools or urine, dysuria,hematuria,  rash, arthralgias, visual complaints, headache, numbness, weakness or ataxia or problems with walking or coordination,  change in mood/affect or memory.                            Objective:   Physical Exam  amb obese wm      Vital signs reviewed  HEENT:top dentures/ o/w nl lower  dentition, turbinates, and orophanx. Nl external ear canals without cough reflex   NECK :  without JVD/Nodes/TM/ nl carotid upstrokes bilaterally   LUNGS: no acc muscle use, clear to A and P bilaterally without cough on insp or exp maneuvers   CV:  RRR  no s3 or murmur or increase in P2, no edema   ABD:  soft and nontender with nl excursion in the supine position. No bruits or organomegaly, bowel sounds nl  MS:  warm without deformities, calf tenderness, cyanosis or clubbing  SKIN: warm and dry without lesions    NEURO:  alert, approp, no deficits   cxr 11/25/14 report in care everywhere  C/w PF/ no acute changes           Assessment & Plan:

## 2015-01-16 NOTE — Assessment & Plan Note (Signed)
Improved on current regimen  Need workup for PF on CT  Cont GERD /AR prevention   Plan   Continue on current regimen  Set up for HRCT chest .  follow up Dr. Vassie LollAlva  In 6 weeks with PFT  Please contact office for sooner follow up if symptoms do not improve or worsen or seek emergency care

## 2015-01-16 NOTE — Patient Instructions (Signed)
Continue on current regimen  Set up for HRCT chest .  follow up Dr. Vassie LollAlva  In 6 weeks with PFT  Please contact office for sooner follow up if symptoms do not improve or worsen or seek emergency care

## 2015-01-16 NOTE — Assessment & Plan Note (Signed)
Needs HRCT chest and PFT

## 2015-01-19 NOTE — Progress Notes (Signed)
Reviewed & agree with plan  

## 2015-01-20 ENCOUNTER — Other Ambulatory Visit: Payer: Self-pay | Admitting: Internal Medicine

## 2015-01-20 NOTE — Telephone Encounter (Signed)
MW please advise on refill. Thanks. 

## 2015-01-21 ENCOUNTER — Ambulatory Visit (INDEPENDENT_AMBULATORY_CARE_PROVIDER_SITE_OTHER)
Admission: RE | Admit: 2015-01-21 | Discharge: 2015-01-21 | Disposition: A | Discharge status: Home / Self Care | Payer: Medicare HMO | Source: Ambulatory Visit | Attending: Adult Health | Admitting: Adult Health

## 2015-01-21 DIAGNOSIS — J8410 Pulmonary fibrosis, unspecified: Secondary | ICD-10-CM

## 2015-01-23 NOTE — Progress Notes (Signed)
Quick Note:  Called and spoke with pt. Reviewed TP's results. Scheduled him for ov with MW on 02/10/15. Vassie Lolllva had no openings until Jan 2017. TP aware and is okay. Pt voiced understanding and had no further questions. ______

## 2015-02-10 ENCOUNTER — Encounter: Payer: Self-pay | Admitting: Internal Medicine

## 2015-02-10 ENCOUNTER — Ambulatory Visit (INDEPENDENT_AMBULATORY_CARE_PROVIDER_SITE_OTHER): Payer: Medicare HMO | Admitting: Internal Medicine

## 2015-02-10 ENCOUNTER — Other Ambulatory Visit (INDEPENDENT_AMBULATORY_CARE_PROVIDER_SITE_OTHER): Payer: Medicare HMO

## 2015-02-10 VITALS — BP 124/72 | HR 101 | Ht 74.0 in | Wt 263.0 lb

## 2015-02-10 DIAGNOSIS — J8410 Pulmonary fibrosis, unspecified: Secondary | ICD-10-CM

## 2015-02-10 DIAGNOSIS — R059 Cough, unspecified: Secondary | ICD-10-CM

## 2015-02-10 DIAGNOSIS — R05 Cough: Secondary | ICD-10-CM | POA: Diagnosis not present

## 2015-02-10 DIAGNOSIS — J449 Chronic obstructive pulmonary disease, unspecified: Secondary | ICD-10-CM

## 2015-02-10 LAB — RHEUMATOID FACTOR

## 2015-02-10 LAB — SEDIMENTATION RATE: SED RATE: 50 mm/h — AB (ref 0–22)

## 2015-02-10 MED ORDER — PREDNISONE 10 MG PO TABS: ORAL_TABLET | ORAL | Status: DC

## 2015-02-10 MED ORDER — TRAMADOL HCL 50 MG PO TABS: ORAL_TABLET | ORAL | Status: DC

## 2015-02-10 NOTE — Progress Notes (Signed)
Subjective:    Patient ID: Bruce Winters, male    DOB: February 10, 1946, 69 y.o.   MRN: [id number]  Brief patient profile:  69 yowm quit smoking around 1990's prev eval for Cough in pulmonary clinic by Bruce Winters [facility] dx as uacs/gerd/ACEi intol referred back to pulmonary clinic by Dr Bruce Winters for refractory cough since first of summer of 2016    History of Present Illness  12/18/2014 1st [place] [facility] office visit/ [facility]/ Diavion Labrador   Chief Complaint  Patient presents with  . Pulmonary Consult    Referred by Dr. [name]. Pt c/o cough for the past 3 wks- prod with grey sputum. He also c/o DOE "for years" progressively worse.   onset was acute "like a chest cold" assoc with nasal congestion / drainage but gradually evolved to a mostly day time dry  hacking brought on by talking worse on  advair and assoc with mild sob when cough is bad but not so much if not coughing  rec zpak  Stop cozar and advair  Start diovan 160 mg one daily  The key to effective treatment for your cough is eliminating the non-stop cycle of cough   Once you have eliminated the cough for 3 straight days try reducing the tramadol first,  then the delsym as tolerated.   For drainage / throat tickle try take CHLORPHENIRAMINE  4 mg - take one every 4 hours as needed    Prednisone 10 mg take  4 each am x 2 days,   2 each am x 2 days,  1 each am x 2 days and stop (this is to eliminate allergies and inflammation from coughing) Protonix (pantoprazole) Take 30-60 min before first meal of the day and Pepcid 20 mg one bedtime plus chlorpheniramine 4 mg x 2 at bedtime (both available over the counter)  until cough is completely gone for at least a week without the need for cough suppression GERD diet   01/01/2015  f/u ov/Bruce Winters re: cough since early 11/2014   Chief Complaint  Patient presents with  . Follow-up    Upper Airway Cough Syndrome. Pt states that his breathing has improved some. Pt c/o of chronic cough that is occasionally wet with  yellow/creamy mucus. Pt c/o SOB regardless of exertion. Pt denies wheeze/tightness. Pt c/o of occasional chest pain on Left side/Left breast area. Pt's albuterol inhaler is empty.     Not clear he actually followed instructions were given and seems quite confused about these instructions and also his maintenance versus prns Has a recurrent pattern of left-sided chest discomfort during coughing fits only.   rec Start diovan 160 mg one daily  First take delsym two tsp every 12 hours and supplement if needed with  tramadol 50 mg up to 2 every 4 hours to suppress the urge to cough at all or even clear your throat. Swallowing water or using ice chips/non mint and menthol containing candies (such as lifesavers or sugarless jolly ranchers) are also effective.  You should rest your voice and avoid activities that you know make you cough. Once you have eliminated the cough for 3 straight days try reducing the tramadol first,  then the delsym as tolerated.   For drainage / throat tickle try take CHLORPHENIRAMINE  4 mg - take one every 4 hours as needed - available over the counter- may cause drowsiness so start with just a bedtime dose or two and see how you tolerate it before trying in daytime   Prednisone  10 mg take  4 each am x 2 days,   2 each am x 2 days,  1 each am x 2 days and stop (this is to eliminate allergies and inflammation from coughing) Omeprazole 20 mg x 2 Take 30-60 min before first meal of the day and Pepcid 20 mg one bedtime  For drainage / throat tickle try take CHLORPHENIRAMINE  4 mg - take one every 4 hours as needed - available over the counter- may cause drowsiness so start with just a bedtime dose or two and see how you tolerate it before trying in daytime   GERD diet  Late add needs hrct and pfts on return    01/16/2015 NP Follow up : Chronic cough and Pulmonary Fibrosis  Pt returns for 2 week follow up .  Pt was seen for cough flare last visit for Dr. Sherene Sires  , he was treated with  cough control regimen with delsym and tramadol . Started on PPI/pepcid and steroid taper .  He is feeling better . Cough is decreased but not resolved.  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  He is a former smoker  We discussed he will need PFT and a HRCT chest .  He is a former [employer] architect. Unsure what he has been exposed to over the years. Was in the [employer] for 4 yrs.  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 Follow-up    Breating is unchanged. His cough has improved some.     Body mass index is 33.75 kg/(m^2).   Very confused about the meds/ some aching in joints esp in am  but no stiffness except in back / using mints to prevent severe coughing fits Most of mucus production is first thing in am   No obvious day to day or daytime variability or assoc   cp or chest tightness, subjective wheeze or overt sinus or hb symptoms. No unusual exp hx or h/o childhood pna/ asthma or knowledge of premature birth.  Sleeping ok without nocturnal  or early am exacerbation  of respiratory  c/o's or need for noct saba. Also denies any obvious fluctuation of symptoms with weather or environmental changes or other aggravating or alleviating factors except as outlined above   Current Medications, Allergies, Complete Past Medical History, Past Surgical History, Family History, and Social History were reviewed in [employer] record.  ROS  The following are not active complaints unless bolded sore throat, dysphagia, dental problems, itching, sneezing,  nasal congestion or excess/ purulent secretions, ear ache,   fever, chills, sweats, unintended wt loss, classically pleuritic or exertional cp, hemoptysis,  orthopnea pnd or leg swelling, presyncope, palpitations, abdominal pain, anorexia, nausea, vomiting, diarrhea  or change in bowel or bladder habits, change in stools or urine,  dysuria,hematuria,  rash, arthralgias, visual complaints, headache, numbness, weakness or ataxia or problems with walking or coordination,  change in mood/affect or memory.                            Objective:   Physical Exam  amb obese wm   Wt Readings from Last 3 Encounters:  02/10/15 263 lb (119.296 kg)  01/16/15 262 lb 3.2 oz (118.933 kg)  01/01/15 259 lb 12.8 oz (117.845 kg)    Vital signs reviewed  HEENT:top dentures/ o/w nl lower  dentition, turbinates, and orophanx. Nl external ear canals without cough reflex   NECK :  without JVD/Nodes/TM/ nl carotid upstrokes bilaterally   LUNGS: no acc muscle use,  Min bilateral insp crackles late insp    CV:  RRR  no s3 or murmur or increase in P2, no edema   ABD:  soft and nontender with nl excursion in the supine position. No bruits or organomegaly, bowel sounds nl  MS:  warm without deformities, calf tenderness, cyanosis or clubbing  SKIN: warm and dry without lesions    NEURO:  alert, approp, no deficits     Labs ordered  02/10/2015 NSIP profile    I personally reviewed images and agree with radiology impression as follows:  HRCT Chest 01/21/15 1. 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 2. 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 3. 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            Assessment & Plan:

## 2015-02-10 NOTE — Patient Instructions (Signed)
The key to effective treatment for your cough is eliminating the non-stop cycle of cough you're stuck in long enough to let your airway heal completely and then see if there is anything still making you cough once you stop the cough suppression, but this should take no more than 5 days to figure out  First take delsym two tsp every 12 hours and supplement if needed with  tramadol 50 mg up to 2 every 4 hours to suppress the urge to cough at all or even clear your throat. Swallowing water or using ice chips/non mint and menthol containing candies (such as lifesavers or sugarless jolly ranchers) are also effective.  You should rest your voice and avoid activities that you know make you cough.  Once you have eliminated the cough for 3 straight days try reducing the tramadol first,  then the delsym as tolerated.    For drainage / throat tickle try take CHLORPHENIRAMINE  4 mg - take one every 4 hours as needed - available over the counter- may cause drowsiness so start with just a bedtime dose or two and see how you tolerate it before trying in daytime    Prednisone 10 mg take  4 each am x 2 days,   2 each am x 2 days,  1 each am x 2 days and stop (this is to eliminate allergies and inflammation from coughing)  Omeprazole 20 mg x 2 Take 30-60 min before first meal of the day and Pepcid 20 mg one bedtime   For drainage / throat tickle try take CHLORPHENIRAMINE  4 mg - take one every 4 hours as needed - available over the counter- may cause drowsiness so start with just a bedtime dose or two and see how you tolerate it before trying in daytime    GERD (REFLUX)  is an extremely common cause of respiratory symptoms, many times with no significant heartburn at all.    It can be treated with medication, but also with lifestyle changes including avoidance of late meals, excessive alcohol, smoking cessation, and avoid fatty foods, chocolate, peppermint, colas, red wine, and acidic juices such as orange juice.  NO  MINT OR MENTHOL PRODUCTS SO NO COUGH DROPS  USE HARD CANDY INSTEAD (jolley ranchers or Stover's or Lifesavers (all available in sugarless versions) NO OIL BASED VITAMINS - use powdered substitutes.  Please remember to go to the lab  department downstairs for your tests - we will call you with the results when they are available.    See Dr  Vassie LollAlva with all your medications in hand in 6 weeks

## 2015-02-11 LAB — CYCLIC CITRUL PEPTIDE ANTIBODY, IGG: Cyclic Citrullin Peptide Ab: <16 Units

## 2015-02-11 LAB — ANA: Anti Nuclear Antibody(ANA): POSITIVE — AB

## 2015-02-11 LAB — ANTI-NUCLEAR AB-TITER (ANA TITER)

## 2015-02-12 ENCOUNTER — Telehealth: Payer: Self-pay | Admitting: Internal Medicine

## 2015-02-12 NOTE — Telephone Encounter (Signed)
Called and spoke with pt Pt requested that labs be sent to Dr Ines Bloomerruslow's office for referral Informed pt that labs would be faxed  Nothing further is needed

## 2015-02-12 NOTE — Progress Notes (Signed)
Quick Note:  Contacted pt with results per MW Pt expressed understanding, i provided pt with contact info for Dr. Kellie Simmeringruslow  Nothing further needed ______

## 2015-02-13 ENCOUNTER — Other Ambulatory Visit: Payer: Self-pay | Admitting: Internal Medicine

## 2015-02-14 ENCOUNTER — Other Ambulatory Visit: Payer: Self-pay | Admitting: Internal Medicine

## 2015-02-15 NOTE — Assessment & Plan Note (Signed)
Quit smoking 1996  pfts 07/12/07 no significant airflow obst   Continues to carry this diagnosis and use albuterol as needed but really has no significant COPD at this point

## 2015-02-15 NOTE — Assessment & Plan Note (Signed)
Complicated by hyperlipidemia  Body mass index is 33.75 trending up   Lab Results  Component Value Date   TSH 0.803   05/19/2007     Contributing to gerd tendency/ doe/reviewed the need and the process to achieve and maintain neg calorie balance > defer f/u primary care including intermittently monitoring thyroid status

## 2015-02-15 NOTE — Assessment & Plan Note (Addendum)
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 2. 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 3. 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 - 01/16/2015  Walked RA x 3 laps @ 185 ft each stopped due to sob/ sat 91% nl pace 02/10/2015  Walked RA x 3 laps @ 185 ft each stopped due to End of study, nl pace, no   desat  But sob/coughing     - 02/10/15 NSIP serology c/w lupus> referred to rheumatology     DDx for pulmonary fibrosis  includes idiopathic pulmonary fibrosis, pulmonary fibrosis associated with rheumatologic diseases (which have a relatively benign course in most cases) , adverse effect from  drugs such as chemotherapy or amiodarone exposure, nonspecific interstitial pneumonia which is typically steroid responsive, and chronic hypersensitivity pneumonitis.   In active  smokers Langerhan's Cell  Histiocyctosis (eosinophilic granuomatosis),  DIP,  and Respiratory Bronchiolitis ILD also need to be considered,  But here the dx is clearly NSIP  associated with collagen vascular disease, most likely rheumatoid arthritis. Therefore, rheum eval is next   In addition, Use of PPI is associated with improved survival time and with decreased radiologic fibrosis per King's study published in [facility] vol 184 p1390.  Dec 2011 and also may have other beneficial effects as per the latest review in [facility] vol 193 p1345 Jun  20016.  This may not always be cause and effect, but given how universally unimpressive and expensive  all the other  Drugs developed to day  have been for pf,   rec start  rx ppi / diet/ lifestyle modification and f/u with serial walking sats and lung volumes for now to put more points on the curve / establish firm baseline before considering additional measures.   I had an extended discussion with the patient reviewing all relevant studies completed to date and  lasting 15 to 20 minutes of a 25 minute visit    Each maintenance medication was reviewed in detail including most importantly the difference between maintenance and prns and under what circumstances the prns are to be triggered using an action plan format that is not reflected in the computer generated alphabetically organized AVS.    Please see instructions for details which were reviewed in writing and the patient given a copy highlighting the part that I personally wrote and discussed at today's ov.

## 2015-02-17 ENCOUNTER — Other Ambulatory Visit: Payer: Self-pay | Admitting: Internal Medicine

## 2015-02-17 DIAGNOSIS — J849 Interstitial pulmonary disease, unspecified: Secondary | ICD-10-CM | POA: Insufficient documentation

## 2015-02-17 LAB — HYPERSENSITIVITY PNUEMONITIS PROFILE

## 2015-02-17 NOTE — Progress Notes (Signed)
Quick Note:  Spoke with pt and notified of results per Dr. Sherene SiresWert. Pt verbalized understanding and denied any questions. [facility] ______

## 2015-04-14 ENCOUNTER — Encounter: Payer: Self-pay | Admitting: Pulmonary Disease

## 2015-04-14 ENCOUNTER — Ambulatory Visit (INDEPENDENT_AMBULATORY_CARE_PROVIDER_SITE_OTHER): Payer: Medicare HMO | Admitting: Pulmonary Disease

## 2015-04-14 ENCOUNTER — Telehealth: Payer: Self-pay | Admitting: Pulmonary Disease

## 2015-04-14 VITALS — BP 128/68 | HR 67 | Ht 74.0 in | Wt 262.0 lb

## 2015-04-14 DIAGNOSIS — J449 Chronic obstructive pulmonary disease, unspecified: Secondary | ICD-10-CM

## 2015-04-14 DIAGNOSIS — J8410 Pulmonary fibrosis, unspecified: Secondary | ICD-10-CM

## 2015-04-14 DIAGNOSIS — J849 Interstitial pulmonary disease, unspecified: Secondary | ICD-10-CM | POA: Diagnosis not present

## 2015-04-14 DIAGNOSIS — G4733 Obstructive sleep apnea (adult) (pediatric): Secondary | ICD-10-CM | POA: Insufficient documentation

## 2015-04-14 DIAGNOSIS — Z9989 Dependence on other enabling machines and devices: Secondary | ICD-10-CM

## 2015-04-14 LAB — PULMONARY FUNCTION TEST
DL/VA % PRED: 60 %
DL/VA: 2.87 ml/min/mmHg/L
DLCO UNC % PRED: 44 %
DLCO unc: 16.31 ml/min/mmHg
FEF 25-75 PRE: 3.67 L/s
FEF 25-75 Post: 4.76 L/sec
FEF2575-%Change-Post: 29 %
FEF2575-%Pred-Post: 171 %
FEF2575-%Pred-Pre: 132 %
FEV1-%Change-Post: 1 %
FEV1-%Pred-Post: 99 %
FEV1-%Pred-Pre: 97 %
FEV1-Post: 3.63 L
FEV1-Pre: 3.56 L
FEV1FVC-%Change-Post: -3 %
FEV1FVC-%Pred-Pre: 113 %
FEV6-%CHANGE-POST: 4 %
FEV6-%PRED-POST: 94 %
FEV6-%PRED-PRE: 90 %
FEV6-POST: 4.42 L
FEV6-PRE: 4.25 L
FEV6FVC-%CHANGE-POST: -1 %
FEV6FVC-%PRED-POST: 103 %
FEV6FVC-%PRED-PRE: 105 %
FVC-%CHANGE-POST: 5 %
FVC-%PRED-POST: 90 %
FVC-%Pred-Pre: 85 %
FVC-Post: 4.49 L
FVC-Pre: 4.25 L
POST FEV1/FVC RATIO: 81 %
POST FEV6/FVC RATIO: 98 %
Pre FEV1/FVC ratio: 84 %
Pre FEV6/FVC Ratio: 100 %

## 2015-04-14 MED ORDER — PREDNISONE 10 MG PO TABS: ORAL_TABLET | ORAL | Status: DC

## 2015-04-14 NOTE — Telephone Encounter (Signed)
Spoke with patient and let him know that information he was given today was based on what was in his chart from the original Rheum referral. I will call Dr. Bing Neighbors office tomorrow to let them know about the Rheum referral and have them start the process. Explained all this to the patient and he voiced understanding.

## 2015-04-14 NOTE — Assessment & Plan Note (Signed)
Advised on ways to decrease leak He gets supplies from the [place] [facility] loss encouraged, compliance with goal of at least 4-6 hrs every night is the expectation. Advised against medications with sedative side effects Cautioned against driving when sleepy - understanding that sleepiness will vary on a day to day basis

## 2015-04-14 NOTE — Progress Notes (Signed)
PFT done today. 

## 2015-04-14 NOTE — Assessment & Plan Note (Signed)
scarring (fibrosis ) in your lungs - this appears stable compared to 2009 Rheumatology consult for positive  blood test Prednisone 10 mg tabs  Take 2 tabs daily with food x 7ds, then 1 tab daily with food x 7ds then STOP Deslym cough syrup 5ml twice daily for cough as needed

## 2015-04-14 NOTE — Patient Instructions (Signed)
You have scarring (fibrosis ) in your lungs - this appears stable compared to 2009 Rheumatology consult for positive  blood test Prednisone 10 mg tabs  Take 2 tabs daily with food x 7ds, then 1 tab daily with food x 7ds then STOP Deslym cough syrup 5ml twice daily for cough as needed  CPAP machine is working well

## 2015-04-14 NOTE — Assessment & Plan Note (Addendum)
scarring (fibrosis ) in your lungs - this appears stable compared to 2009 Rheumatology consult for positive  ANA ? False positive - no arthritis , but may have NSIP Ideally needs biopsy but defer since PFTs stable Prednisone 10 mg tabs  Take 2 tabs daily with food x 7ds, then 1 tab daily with food x 7ds then STOP Deslym cough syrup 5ml twice daily for cough as needed

## 2015-04-14 NOTE — Assessment & Plan Note (Signed)
Emphysema, but no obs on PFT

## 2015-04-14 NOTE — Progress Notes (Signed)
   Subjective:    Patient ID: Bruce Winters, male    DOB: 06/27/1945, 70 y.o.   MRN: [id number]  HPI  45 yowm quit smoking around 1990's for FU of ILD (presumed NSIP) & OSA   prev eval for Cough in  2009 dx as uacs/gerd/ACEi intol referred back to pulmonary clinic by Dr Harvie Heck for refractory cough since first of summer of 2016   04/14/2015  Chief Complaint  Patient presents with  . Follow-up    PFT done. Pt c/o continued cough with yellow mucus, SOB and some wheeze. Pt states that he does use his CPAP nightly but does sometimes remove his mask in the middle of the night. Pt has set his pressure to 4. Pt denies daytime somnolence.     He had 3 office visits Pt was seen for cough flare last visit for Dr. Melvyn Novas , he was treated with cough control regimen with delsym and tramadol . Started on PPI/pepcid and steroid taper .   Cough is decreased but not resolved.  Referred to rheum- but apparently referral has to come from PCP hence never made it to her appointment  He is a former smoker  He is a former Administrator. Unsure what he has been exposed to over the years. Was in the [employer] for 4 yrs.   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 Moderate centrilobular and paraseptal emphysema  01/2015 ESR 50, ANA 1: 160 homogenous, CCP neg> hypersens panel neg  PFTs 03/2015 >> no obs, FVC 85%, TLC 67 % (N2 wash out), DLCO 44% - 48% in 2009  Download 03/2015 on 11 cm, good usage, leak ++  Review of Systems neg for any significant sore throat, dysphagia, itching, sneezing, nasal congestion or excess/ purulent secretions, fever, chills, sweats, unintended wt loss, pleuritic or exertional cp, hempoptysis, orthopnea pnd  or change in chronic leg swelling. Also denies presyncope, palpitations, heartburn, abdominal pain, nausea, vomiting, diarrhea or change in bowel or urinary habits, dysuria,hematuria, rash, arthralgias, visual complaints, headache, numbness weakness or ataxia.     Objective:   Physical Exam  Gen. Pleasant, obese, in no distress, normal affect ENT - no lesions, no post nasal drip, class 2-3 airway Neck: No JVD, no thyromegaly, no carotid bruits Lungs: no use of accessory muscles, no dullness to percussion, decreased without rales or rhonchi  Cardiovascular: Rhythm regular, heart sounds  normal, no murmurs or gallops, no peripheral edema Abdomen: soft and non-tender, no hepatosplenomegaly, BS normal. Musculoskeletal: No deformities, no cyanosis or clubbing Neuro:  alert, non focal, no tremors       Assessment & Plan:

## 2015-04-15 ENCOUNTER — Telehealth: Payer: Self-pay | Admitting: Pulmonary Disease

## 2015-04-15 NOTE — Telephone Encounter (Signed)
Called [facility] in [place] at the below number, verified that pt is still seeing Dr. [name].  Advised that per RA's ov note yesterday pt needed rheum consult from PCP office d/t pt's insurance.  Reason for referral: positive ANA.   Message was left with referral coordinator.  Referral will be placed through PCP.  Forwarding to RA and Sheena to make aware.  Nothing further needed at this time.

## 2015-04-15 NOTE — Telephone Encounter (Signed)
Left message for Tia, Referral Coordinator at Dr. Bing Neighbors office, asking for a referral to Rheum for this patient. Since they are with [facility] all labs and OV notes are in chart.

## 2015-04-16 NOTE — Telephone Encounter (Signed)
Noted. Dr. Bing Neighbors old office took all the referral information and not once advised me that she no longer practices there. Even after mentioning several times that this was a mutual patient between [facility] and Dr. [name].

## 2015-07-13 ENCOUNTER — Encounter: Payer: Self-pay | Admitting: Adult Health

## 2015-07-13 ENCOUNTER — Ambulatory Visit (INDEPENDENT_AMBULATORY_CARE_PROVIDER_SITE_OTHER): Payer: Medicare HMO | Admitting: Adult Health

## 2015-07-13 VITALS — BP 126/78 | HR 76 | Temp 98.3°F | Ht 75.0 in | Wt 259.0 lb

## 2015-07-13 DIAGNOSIS — J8410 Pulmonary fibrosis, unspecified: Secondary | ICD-10-CM

## 2015-07-13 DIAGNOSIS — G4733 Obstructive sleep apnea (adult) (pediatric): Secondary | ICD-10-CM | POA: Diagnosis not present

## 2015-07-13 DIAGNOSIS — Z9989 Dependence on other enabling machines and devices: Secondary | ICD-10-CM

## 2015-07-13 NOTE — Assessment & Plan Note (Signed)
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 Pt is poor historian, remembers going but does not remember what that said. Records requested.  Most recent PFT and CT chest showed stable changes since 2009.  Advised on cough control options with delsym  Plan Can use Delsym cough syrup As needed  Cough .  Get records from rheumatology -requested.  Follow up Dr. Vassie LollAlva  In 3-4 months and As needed   [facility] or [facility] to see if you have gotten Pneumovax or Prevnar vaccines- .

## 2015-07-13 NOTE — Progress Notes (Signed)
 Subjective:    Patient ID: Bruce Winters, male    DOB: 09/29/1945, 70 y.o.   MRN: [id number]  HPI 69 yowm quit smoking around 1990's for FU of ILD (presumed NSIP) & OSA   prev eval for Cough in  2009 dx as uacs/gerd/ACEi intol referred back to pulmonary clinic by Dr T Howell for refractory cough since first of summer of 2016   04/14/2015  Chief Complaint  Patient presents with  . Follow-up    PFT done. Pt c/o continued cough with yellow mucus, SOB and some wheeze. Pt states that he does use his CPAP nightly but does sometimes remove his mask in the middle of the night. Pt has set his pressure to 4. Pt denies daytime somnolence.     He had 3 office visits Pt was seen for cough flare last visit for Dr. Wert , he was treated with cough control regimen with delsym and tramadol . Started on PPI/pepcid and steroid taper .   Cough is decreased but not resolved.  Referred to rheum- but apparently referral has to come from PCP hence never made it to her appointment  He is a former smoker  He is a former truck driver. Unsure what he has been exposed to over the years. Was in the [employer] for 4 yrs.   [facility] is [facility], has full face mask   07/13/2015 Follow up : ILD (presumed NSIP)  and OSA Pt returns for 3 month follow up  Seen by rheumatology but does remember what they said.  Records requested.  Gets winded with activity and has dry cough most days.  No fever, chest pain, hemoptysis , overt reflux, n/v/d., orthopnea or edema.    Remain on CPAP .  [facility]  Wears every night, 6-8 hr. Does not miss a night.  Feels rested in am.  Denies any mask issues.    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 Moderate centrilobular and paraseptal  emphysema  01/2015 ESR 50, ANA 1: 160 homogenous, CCP neg> hypersens panel neg  PFTs 03/2015 >> no obs, FVC 85%, TLC 67 % (N2 wash out), DLCO 44% - 48% in 2009  Download 03/2015 on 11 cm, good usage, leak ++  Review of Systems neg for any significant sore throat, dysphagia, itching, sneezing, nasal congestion or excess/ purulent secretions, fever, chills, sweats, unintended wt loss, pleuritic or exertional cp, hempoptysis, orthopnea pnd or change in chronic leg swelling. Also denies presyncope, palpitations, heartburn, abdominal pain, nausea, vomiting, diarrhea or change in bowel or urinary habits, dysuria,hematuria, rash, arthralgias, visual complaints, headache, numbness weakness or ataxia.     Objective:   Physical Exam  Filed Vitals:   07/13/15 1125  BP: 126/78  Pulse: 76  Temp: 98.3 F (36.8 C)  TempSrc: Oral  Height: 6' 3" (1.905 m)  Weight: 259 lb (117.482 kg)  SpO2: 95%     Gen. Pleasant, obese, in no distress, normal affect ENT - no lesions, no post nasal drip, class 2-3 airway Neck: No JVD, no thyromegaly, no carotid bruits Lungs: no use of accessory muscles, no dullness to percussion, Faint BB rales.  Cardiovascular: Rhythm regular, heart sounds  normal, no murmurs or gallops, no peripheral edema Abdomen: soft and non-tender, no hepatosplenomegaly, BS normal. Musculoskeletal: No deformities, no cyanosis   or clubbing Neuro:  alert, non focal, no tremors  Tammy Parrett NP-C  [facility]  07/13/2015      Assessment & Plan:

## 2015-07-13 NOTE — Assessment & Plan Note (Signed)
Doing well   Plan Continue on CPAP At bedtime  .  Do not drive if sleepy .  Follow up Dr. Vassie LollAlva  In 3-4 months and As needed   [facility] or [facility] to see if you have gotten Pneumovax or Prevnar vaccines- .

## 2015-07-13 NOTE — Patient Instructions (Addendum)
Continue on CPAP At bedtime  .  Do not drive if sleepy .  Can use Delsym cough syrup As needed  Cough .  Get records from rheumatology -requested.  Follow up Dr. Vassie LollAlva  In 3-4 months and As needed   [facility] with [facility] or [facility] to see if you have gotten Pneumovax or Prevnar vaccines- .

## 2015-10-19 ENCOUNTER — Ambulatory Visit (INDEPENDENT_AMBULATORY_CARE_PROVIDER_SITE_OTHER): Payer: Medicare HMO | Admitting: Pulmonary Disease

## 2015-10-19 ENCOUNTER — Encounter: Payer: Self-pay | Admitting: Pulmonary Disease

## 2015-10-19 DIAGNOSIS — J849 Interstitial pulmonary disease, unspecified: Secondary | ICD-10-CM

## 2015-10-19 DIAGNOSIS — G4733 Obstructive sleep apnea (adult) (pediatric): Secondary | ICD-10-CM

## 2015-10-19 DIAGNOSIS — R05 Cough: Secondary | ICD-10-CM | POA: Diagnosis not present

## 2015-10-19 DIAGNOSIS — Z9989 Dependence on other enabling machines and devices: Secondary | ICD-10-CM

## 2015-10-19 DIAGNOSIS — R058 Other specified cough: Secondary | ICD-10-CM

## 2015-10-19 NOTE — Patient Instructions (Signed)
You have scarring in the lungs Cough is related to this and to reflux  We will arrange for CT scan in October Take Delsym cough syrup as needed Keep your heartburn under control with medications

## 2015-10-19 NOTE — Assessment & Plan Note (Signed)
We will arrange for CT scan in October Take Delsym cough syrup as needed Ambulatory saturation-he does not want to perform repeat PFTs

## 2015-10-19 NOTE — Progress Notes (Signed)
Subjective:    Patient ID: Bruce Winters, male    DOB: 01-13-1946, 70 y.o.   MRN: [id number]  HPI  36 yowm quit smoking around 1990's for FU of ILD (presumed NSIP) & OSA   prev eval for Cough in  2009 dx as uacs/gerd/ACEi intol referred back to pulmonary clinic by Dr Harvie Heck for refractory cough since first of summer of 2016   10/19/2015  Chief Complaint  Patient presents with  . Follow-up    did not bring SD card.  Need download.  Pt still feeling tired, wears CPAP every night.  Pt states he is on 4cw.       His cough is persistent and he uses sugarless candy with some relief He is able to control his heartburn with Pepcid and Prilosec He did see rheumatology and no new recommendations were given He shortness of breath is at baseline-no worse Cough does not wake him up at night  Reports compliance with his CPAP but wakes up feeling tired  He is a former smoker  He is a former Administrator.    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 Moderate centrilobular and paraseptal emphysema  01/2015 ESR 50, ANA 1: 160 homogenous, CCP neg> hypersens panel neg  PFTs 03/2015 >> no obs, FVC 85%, TLC 67 % (N2 wash out), DLCO 44% - 48% in 2009  Download 03/2015 on 11 cm, good usage, leak ++   Review of Systems Patient denies significant dyspnea,cough, hemoptysis,  chest pain, palpitations, pedal edema, orthopnea, paroxysmal nocturnal dyspnea, lightheadedness, nausea, vomiting, abdominal or  leg pains      Objective:   Physical Exam  Gen. Pleasant, obese, in no distress ENT - no lesions, no post nasal drip Neck: No JVD, no thyromegaly, no carotid bruits Lungs: no use of accessory muscles, no  dullness to percussion, decreased without rales or rhonchi  Cardiovascular: Rhythm regular, heart sounds  normal, no murmurs or gallops, no peripheral edema Musculoskeletal: No deformities, no cyanosis or clubbing , no tremors        Assessment & Plan:

## 2015-10-19 NOTE — Assessment & Plan Note (Signed)
Chronic cough is related to ILD and to GERD

## 2015-10-19 NOTE — Assessment & Plan Note (Signed)
CPAP compliance is good by report

## 2015-12-22 ENCOUNTER — Other Ambulatory Visit: Payer: Self-pay | Admitting: Internal Medicine

## 2016-04-21 ENCOUNTER — Encounter: Payer: Self-pay | Admitting: Pulmonary Disease

## 2016-04-21 ENCOUNTER — Ambulatory Visit (INDEPENDENT_AMBULATORY_CARE_PROVIDER_SITE_OTHER): Payer: Medicare HMO | Admitting: Pulmonary Disease

## 2016-04-21 DIAGNOSIS — J849 Interstitial pulmonary disease, unspecified: Secondary | ICD-10-CM

## 2016-04-21 DIAGNOSIS — Z9989 Dependence on other enabling machines and devices: Secondary | ICD-10-CM | POA: Diagnosis not present

## 2016-04-21 DIAGNOSIS — G4733 Obstructive sleep apnea (adult) (pediatric): Secondary | ICD-10-CM | POA: Diagnosis not present

## 2016-04-21 NOTE — Assessment & Plan Note (Signed)
Appears compliant CPAP is certainly helping his symptoms of daytime somnolence and snoring  Weight loss encouraged, compliance with goal of at least 4-6 hrs every night is the expectation. Advised against medications with sedative side effects Cautioned against driving when sleepy - understanding that sleepiness will vary on a day to day basis

## 2016-04-21 NOTE — Progress Notes (Signed)
   Subjective:    Patient ID: Bruce Winters, male    DOB: Apr 27, 1945, 71 y.o.   MRN: [id number]  HPI  2 yowm quit smoking around 1990's for FU of ILD (presumed NSIP) & OSA  prev eval for Cough in 2009 dx as uacs/gerd/ACEi intol referred back to pulmonary clinic by Dr Harvie Heck for refractory cough since first of summer of 2016   He is a former smoker  He is a former Administrator.   04/21/2016  Chief Complaint  Patient presents with  . Follow-up    6 month follow up. Breathing has been about the same. Denies any increased SOB.      His care scattered between multiple providers at the [facility], [facility] and [facility]. It does seem that he has not had a CAT scan done yet  His cough is persistent, he wakes up every morning and takes off his CPAP and has to cough a little bit of clear white sputum. He does feel that his dyspnea is slightly worse, especially on ambulation. 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 Moderate centrilobular and paraseptal emphysema  01/2015 ESR 50, ANA 1: 160 homogenous, CCP neg> hypersens panel neg  PFTs 03/2015 >> no obs, FVC 85%, TLC 67 % (N2 wash out), DLCO 44% - 48% in 2009  Download 03/2015 on 11 cm, good usage, leak ++   Review of Systems neg for any significant sore throat, dysphagia, itching, sneezing, nasal congestion or excess/ purulent secretions, fever, chills, sweats, unintended wt loss,  pleuritic or exertional cp, hempoptysis, orthopnea pnd or change in chronic leg swelling. Also denies presyncope, palpitations, heartburn, abdominal pain, nausea, vomiting, diarrhea or change in bowel or urinary habits, dysuria,hematuria, rash, arthralgias, visual complaints, headache, numbness weakness or ataxia.     Objective:   Physical Exam  Gen. Pleasant, well-nourished, in no distress ENT - no lesions, no post nasal drip Neck: No JVD, no thyromegaly, no carotid bruits Lungs: no use of accessory muscles, no dullness to percussion, bibasal rales , no rhonchi  Cardiovascular: Rhythm regular, heart sounds  normal, no murmurs or gallops, no peripheral edema Musculoskeletal: No deformities, no cyanosis or clubbing        Assessment & Plan:

## 2016-04-21 NOTE — Patient Instructions (Signed)
Proceed with high resolution CT of the chest Pulmonary function tests -with office visit in 4 weeks

## 2016-04-21 NOTE — Assessment & Plan Note (Signed)
Repeat high-resolution CT scan and PFTs for 1 year follow-up-to see if there is worsening to explain his dyspnea

## 2016-05-20 ENCOUNTER — Ambulatory Visit (HOSPITAL_COMMUNITY)
Admission: RE | Admit: 2016-05-20 | Discharge: 2016-05-20 | Disposition: A | Discharge status: Home / Self Care | Payer: Medicare HMO | Source: Ambulatory Visit | Attending: Pulmonary Disease | Admitting: Pulmonary Disease

## 2016-05-20 ENCOUNTER — Ambulatory Visit (INDEPENDENT_AMBULATORY_CARE_PROVIDER_SITE_OTHER): Payer: Medicare HMO | Admitting: Pulmonary Disease

## 2016-05-20 ENCOUNTER — Encounter: Payer: Self-pay | Admitting: Pulmonary Disease

## 2016-05-20 VITALS — BP 122/82 | HR 87 | Ht 74.0 in | Wt 260.0 lb

## 2016-05-20 DIAGNOSIS — J849 Interstitial pulmonary disease, unspecified: Secondary | ICD-10-CM | POA: Insufficient documentation

## 2016-05-20 DIAGNOSIS — G4733 Obstructive sleep apnea (adult) (pediatric): Secondary | ICD-10-CM | POA: Diagnosis not present

## 2016-05-20 DIAGNOSIS — J449 Chronic obstructive pulmonary disease, unspecified: Secondary | ICD-10-CM

## 2016-05-20 DIAGNOSIS — Z9989 Dependence on other enabling machines and devices: Secondary | ICD-10-CM

## 2016-05-20 LAB — PULMONARY FUNCTION TEST
DL/VA % PRED: 45 %
DL/VA: 2.18 ml/min/mmHg/L
DLCO UNC % PRED: 32 %
DLCO UNC: 12.4 ml/min/mmHg
FEF 25-75 POST: 3.7 L/s
FEF 25-75 Pre: 3.02 L/sec
FEF2575-%Change-Post: 22 %
FEF2575-%PRED-PRE: 107 %
FEF2575-%Pred-Post: 131 %
FEV1-%Change-Post: 2 %
FEV1-%Pred-Post: 94 %
FEV1-%Pred-Pre: 92 %
FEV1-PRE: 3.45 L
FEV1-Post: 3.53 L
FEV1FVC-%CHANGE-POST: 2 %
FEV1FVC-%PRED-PRE: 106 %
FEV6-%Change-Post: 1 %
FEV6-%Pred-Post: 91 %
FEV6-%Pred-Pre: 90 %
FEV6-POST: 4.41 L
FEV6-Pre: 4.35 L
FEV6FVC-%Change-Post: 1 %
FEV6FVC-%PRED-POST: 105 %
FEV6FVC-%Pred-Pre: 103 %
FVC-%Change-Post: 0 %
FVC-%PRED-PRE: 86 %
FVC-%Pred-Post: 86 %
FVC-POST: 4.41 L
FVC-PRE: 4.42 L
PRE FEV1/FVC RATIO: 78 %
Post FEV1/FVC ratio: 80 %
Post FEV6/FVC ratio: 100 %
Pre FEV6/FVC Ratio: 98 %
RV % pred: 84 %
RV: 2.27 L
TLC % PRED: 84 %
TLC: 6.65 L

## 2016-05-20 MED ORDER — ALBUTEROL SULFATE (2.5 MG/3ML) 0.083% IN NEBU
2.5000 mg | INHALATION_SOLUTION | Freq: Once | RESPIRATORY_TRACT | Status: AC
  Administered 2016-05-20: 2.5 mg via RESPIRATORY_TRACT

## 2016-05-20 NOTE — Assessment & Plan Note (Signed)
No evidence of airway obstruction.  Doubt that he will have any response to bronchial dilators

## 2016-05-20 NOTE — Assessment & Plan Note (Signed)
Weight loss encouraged, compliance with goal of at least 4-6 hrs every night is the expectation. Advised against medications with sedative side effects Cautioned against driving when sleepy - understanding that sleepiness will vary on a day to day basis  

## 2016-05-20 NOTE — Assessment & Plan Note (Signed)
High-resolution CT scan of  chest Based on this, we may decide to put him on anti-fibrotic therapy.  He does seem to have more emphysema at the apices and fibrosis at the bases-we will see if CT scan is consistent with IPF

## 2016-05-20 NOTE — Progress Notes (Signed)
   Subjective:    Patient ID: Bruce Winters, male    DOB: 02/13/1946, 70 y.o.   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   05/20/2016  He continues to complain of dyspnea on exertion. Has a dry cough on occasion which does not go away. He smoked about 30-pack-years before he quit in 90s We reviewed his PFTs which show worsening diffusion capacity to 32%  [facility] is [facility] with [facility], has full face mask, [facility], on 11 cm   Significant tests/ events  HRCT 01/21/15 ILD only minimally progressed since 2009, suggesting fibrotic phase NSIP New prominent dense peribronchovascular nodularity in the upper lobes, 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 Moderate centrilobular and paraseptal emphysema  01/2015 ESR 50, ANA 1: 160 homogenous, CCP neg> hypersens panel neg >> has seen rheumatology  PFTs 03/2015 >> no obs, FVC 85%, TLC 67 % (N2 wash out), DLCO 44% - 48% in 2009     Review of Systems neg for any significant sore throat, dysphagia, itching, sneezing, nasal congestion or excess/ purulent secretions, fever, chills, sweats, unintended wt loss, pleuritic or exertional cp, hempoptysis, orthopnea pnd or change in chronic leg swelling. Also denies presyncope, palpitations, heartburn, abdominal pain, nausea, vomiting, diarrhea or change in bowel or urinary habits, dysuria,hematuria, rash, arthralgias, visual complaints, headache, numbness weakness or ataxia.     Objective:   Physical Exam  Gen. Pleasant, well-nourished, in no distress ENT - no lesions, no post nasal drip Neck: No JVD, no thyromegaly, no carotid bruits Lungs: no use of accessory muscles, no dullness  to percussion, bibasal rales, no rhonchi  Cardiovascular: Rhythm regular, heart sounds  normal, no murmurs or gallops, no peripheral edema Musculoskeletal: No deformities, no cyanosis or clubbing        Assessment & Plan:   

## 2016-05-20 NOTE — Patient Instructions (Signed)
High-resolution CT scan of  chest Based on this, we may decide to put you on medications for fibrosis in your lungs

## 2016-05-25 ENCOUNTER — Ambulatory Visit (INDEPENDENT_AMBULATORY_CARE_PROVIDER_SITE_OTHER)
Admission: RE | Admit: 2016-05-25 | Discharge: 2016-05-25 | Disposition: A | Discharge status: Home / Self Care | Payer: Medicare HMO | Source: Ambulatory Visit | Attending: Pulmonary Disease | Admitting: Pulmonary Disease

## 2016-05-25 DIAGNOSIS — J849 Interstitial pulmonary disease, unspecified: Secondary | ICD-10-CM

## 2016-07-06 ENCOUNTER — Ambulatory Visit (INDEPENDENT_AMBULATORY_CARE_PROVIDER_SITE_OTHER): Payer: Medicare HMO | Admitting: Pulmonary Disease

## 2016-07-06 ENCOUNTER — Encounter: Payer: Self-pay | Admitting: Pulmonary Disease

## 2016-07-06 DIAGNOSIS — Z9989 Dependence on other enabling machines and devices: Secondary | ICD-10-CM | POA: Diagnosis not present

## 2016-07-06 DIAGNOSIS — J449 Chronic obstructive pulmonary disease, unspecified: Secondary | ICD-10-CM | POA: Diagnosis not present

## 2016-07-06 DIAGNOSIS — J849 Interstitial pulmonary disease, unspecified: Secondary | ICD-10-CM

## 2016-07-06 DIAGNOSIS — G4733 Obstructive sleep apnea (adult) (pediatric): Secondary | ICD-10-CM | POA: Diagnosis not present

## 2016-07-06 NOTE — Assessment & Plan Note (Signed)
Continue CPAP 11 cm

## 2016-07-06 NOTE — Patient Instructions (Signed)
Use albuterol MDI as needed for shortness of breath

## 2016-07-06 NOTE — Assessment & Plan Note (Signed)
His dyspnea and cough are stable CT imaging has atypical features and he does not want a biopsy Since symptoms and PFTs are stable we'll continue to observe

## 2016-07-06 NOTE — Progress Notes (Signed)
   Subjective:    Patient ID: Bruce Winters, male    DOB: 02-10-1946, 71 y.o.   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   07/06/2016  Chief Complaint  Patient presents with  . Follow-up    Pt here after HRCT. Pt denies change in his breathing since last OV. Pt states he still has a prod cough with tan colored mucus. Pt denies CP/tightness and f/c/s.    His cough has decreased-he continues to use hard candy sometimes. His dyspnea and exertion has stabilized  We reviewed his CT scan today  We reviewed his PFTs which show worsening diffusion capacity to 32%  He got his CPAP from the [place], is very compliant with this, has full face mask, gets supplies from the [place], on 11 cm  He desaturated to 89% on walking 3 laps and recovered spontaneously and rest Significant tests/ events  HRCT 04/2016  ILD stable since 2009, suggesting fibrotic phase NSIP         Upper lobe predominant calcifications ? Silica exposure. Moderate centrilobular and paraseptal emphysema  01/2015 ESR 50, ANA 1: 160 homogenous, CCP neg> hypersens panel neg >> has seen rheumatology  PFTs 03/2015 >> no obs, FVC 85%, TLC 67 % (N2 wash out), DLCO 44% - 48% in 2009 PFTs 04/2016 FVC 84%, TLC 84%, DLCO 32% corrects for alveolar volume to 45%    Review of Systems neg for any significant sore throat, dysphagia, itching, sneezing, nasal congestion or excess/ purulent secretions, fever, chills, sweats, unintended wt loss, pleuritic or exertional cp, hempoptysis, orthopnea pnd or change in chronic leg swelling. Also denies presyncope, palpitations, heartburn,  abdominal pain, nausea, vomiting, diarrhea or change in bowel or urinary habits, dysuria,hematuria, rash, arthralgias, visual complaints, headache, numbness weakness or ataxia.     Objective:   Physical Exam  Gen. Pleasant, well-nourished, in no distress ENT - no thrush, no post nasal drip Neck: No JVD, no thyromegaly, no carotid bruits Lungs: no use of accessory muscles, no dullness to percussion, clear without rales or rhonchi  Cardiovascular: Rhythm regular, heart sounds  normal, no murmurs or gallops, no peripheral edema Musculoskeletal: No deformities, no cyanosis or clubbing         Assessment & Plan:

## 2016-07-06 NOTE — Assessment & Plan Note (Signed)
He does not have significant obstruction on PFTs in no subjective improvement with bronchodilators We'll only give him albuterol to use as needed

## 2016-12-30 ENCOUNTER — Telehealth: Payer: Self-pay | Admitting: Pulmonary Disease

## 2016-12-30 NOTE — Telephone Encounter (Signed)
lmomtcb x1 

## 2017-01-02 NOTE — Telephone Encounter (Signed)
ATC pt, no answer. Left message for pt to call back.  

## 2017-01-03 NOTE — Telephone Encounter (Signed)
lmtcb x3 for pt. 

## 2017-01-04 NOTE — Telephone Encounter (Signed)
We have attempted to contact the pt several times with no success or call back from the pt. Per triage protocol, message will be closed.  

## 2017-01-09 ENCOUNTER — Ambulatory Visit (INDEPENDENT_AMBULATORY_CARE_PROVIDER_SITE_OTHER)
Admission: RE | Admit: 2017-01-09 | Discharge: 2017-01-09 | Disposition: A | Discharge status: Home / Self Care | Payer: Medicare HMO | Source: Ambulatory Visit | Attending: Adult Health | Admitting: Adult Health

## 2017-01-09 ENCOUNTER — Encounter: Payer: Self-pay | Admitting: Adult Health

## 2017-01-09 ENCOUNTER — Ambulatory Visit (INDEPENDENT_AMBULATORY_CARE_PROVIDER_SITE_OTHER): Payer: Medicare HMO | Admitting: Adult Health

## 2017-01-09 VITALS — BP 140/78 | HR 93 | Ht 74.0 in | Wt 238.2 lb

## 2017-01-09 DIAGNOSIS — J8410 Pulmonary fibrosis, unspecified: Secondary | ICD-10-CM

## 2017-01-09 DIAGNOSIS — J449 Chronic obstructive pulmonary disease, unspecified: Secondary | ICD-10-CM

## 2017-01-09 DIAGNOSIS — Z9989 Dependence on other enabling machines and devices: Secondary | ICD-10-CM | POA: Diagnosis not present

## 2017-01-09 DIAGNOSIS — G4733 Obstructive sleep apnea (adult) (pediatric): Secondary | ICD-10-CM

## 2017-01-09 DIAGNOSIS — J9611 Chronic respiratory failure with hypoxia: Secondary | ICD-10-CM | POA: Diagnosis not present

## 2017-01-09 DIAGNOSIS — J9610 Chronic respiratory failure, unspecified whether with hypoxia or hypercapnia: Secondary | ICD-10-CM | POA: Insufficient documentation

## 2017-01-09 NOTE — Progress Notes (Signed)
$'@[email or web address]'W$  ID: Bruce Winters, male    DOB: Nov 13, 1945, 71 y.o.   MRN: [id number]  Chief Complaint  Patient presents with  . 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    Significant tests/ events  HRCT 04/2016  ILD stable since 2009, suggesting fibrotic phase NSIP         Upper lobe predominant calcifications ? Silica exposure. Moderate centrilobular and paraseptal emphysema  01/2015 ESR 50, ANA 1: 160 homogenous, CCP neg> hypersens panel neg >>has seen rheumatology  PFTs 03/2015 >> no obs, FVC 85%, TLC 67 % (N2 wash out), DLCO 44% - 48% in 2009 PFTs 04/2016 FVC 84%, TLC 84%, DLCO 32% corrects for alveolar volume to 45%   01/09/2017 Follow up : ILD and OSA  Patient returns for a six-month follow-up. Patient has known ILD presumed fibrotic NSIP .  Pt says he was dx with PNA in July and was admitted at [facility]. CareEverywhere shows he possible PNA superimposed on ILD  . Required Intubation /Vent suuport after failing BIPAP .  He was treated with IV abx, steroids and nebs.  Discharged on oxygen 4l/m . Reviewed care everywhere notes. Admission notes found but no discharge notes.  He says he is doing about the same, gets very winded with minimal activity .  o2 sats 92% on 2l/m at rest. Needs 4l/m walking to keep sat >90%.  complians of daily cough, minimally productive.  No fever, chest pain, orthopnea.   Pt says he memory is getting bad. He is on Aricept. He has a sister and daughter that help him. Advised on living will and HCPOA.   Pt is followed by Cards with CHF  . Denies increased LE edema.   Remains on CPAP At bedtime  . Says he is doing okay . Wears it each night.    Allergies  Allergen Reactions  . Penicillins Anaphylaxis    Immunization History  Administered Date(s) Administered  . Influenza Split 11/27/2014  . Influenza, High Dose Seasonal PF 12/12/2016  . Influenza,inj,Quad PF,6+ Mos 01/27/2016  . Pneumococcal Conjugate-13 12/12/2016    Past Medical History:  Diagnosis Date  . Heart attack ([place])   . Hyperlipidemia   . OSA (obstructive sleep apnea)    on CPAP     Tobacco History: History  Smoking Status  . Former Smoker  . Packs/day: 1.50  . Years: 15.00  . Types: Cigarettes  . Quit date: 03/28/1994  Smokeless Tobacco  . Never Used   Counseling given: Not Answered   Outpatient Encounter Prescriptions as of 01/09/2017  Medication Sig  . aspirin 81 MG tablet Take 81 mg by mouth daily.  . Cyanocobalamin (VITAMIN B 12 PO) Take by mouth daily.  Marland Kitchen donepezil (ARICEPT) 10 MG tablet Take 10 mg by mouth at bedtime.  Marland Kitchen FLUoxetine (PROZAC) 20 MG tablet Take 20 mg by mouth daily.  . fluticasone furoate-vilanterol (BREO ELLIPTA) 200-25 MCG/INH AEPB Inhale 1 puff into the lungs daily.  . hydrochlorothiazide (HYDRODIURIL) 25 MG tablet Take  25 mg by mouth daily.  Marland Kitchen ibuprofen (ADVIL,MOTRIN) 600 MG tablet Take 600 mg by mouth 3 (three) times daily.  . Multiple Vitamins-Minerals (CENTRUM PO) Take 1 tablet by mouth daily.  . pantoprazole (PROTONIX) 40 MG tablet Take 40 mg by mouth 2 (two) times daily before a meal.  . Polyvinyl Alcohol-Povidone (REFRESH OP) Apply to eye.  . simvastatin (ZOCOR) 20 MG tablet Take 20 mg by mouth daily.  . traZODone (DESYREL) 100 MG tablet Take 100 mg by mouth at bedtime as needed for sleep.   . [DISCONTINUED] omeprazole (PRILOSEC) 20 MG capsule Take 40 mg by mouth daily.   No facility-administered encounter medications on file as of 01/09/2017.      Review of Systems  Constitutional:   No  weight  loss, night sweats,  Fevers, chills,  +fatigue, or  lassitude.  HEENT:   No headaches,  Difficulty swallowing,  Tooth/dental problems, or  Sore throat,                No sneezing, itching, ear ache, nasal congestion, post nasal drip,   CV:  No chest pain,  Orthopnea, PND, swelling in lower extremities, anasarca, dizziness, palpitations, syncope.   GI  No heartburn, indigestion, abdominal pain, nausea, vomiting, diarrhea, change in bowel habits, loss of appetite, bloody stools.   Resp .  No chest wall deformity  Skin: no rash or lesions.  GU: no dysuria, change in color of urine, no urgency or frequency.  No flank pain, no hematuria   MS:  No joint pain or swelling.  No decreased range of motion.  No back pain.    Physical Exam  BP 140/78 (BP Location: Left Arm, Cuff Size: Normal)   Pulse 93   Ht _0  (1.88 m)   Wt 238 lb 3.2 oz (108 kg)   SpO2 98%   BMI 30.58 kg/m   GEN: A/Ox3; pleasant , NAD, elderly obese,   HEENT:  [place]/AT,  EACs-clear, TMs-wnl, NOSE-clear, THROAT-clear, no lesions, no postnasal drip or exudate noted.   NECK:  Supple w/ fair ROM; no JVD; normal carotid impulses w/o bruits; no thyromegaly or nodules palpated; no lymphadenopathy.    RESP  BB crackles , . no accessory muscle use, no dullness to percussion  CARD:  RRR, no m/r/g, tr  peripheral edema, pulses intact, no cyanosis or clubbing.  GI:   Soft & nt; nml bowel sounds; no organomegaly or masses detected.   Musco: Warm bil, no deformities or joint swelling noted.   Neuro: alert, no focal deficits noted.    Skin: Warm, no lesions or rashes    Lab Results:    BNP Imaging: Dg Chest 2 View  Result Date: 01/09/2017 CLINICAL DATA:  Follow-up of pneumonia. No current complaints. History of COPD, post inflammatory pulmonary fibrosis, previous MI, former smoker. EXAM: CHEST  2 VIEW COMPARISON:  CT scan of the chest of May 25, 2016 and chest x-ray of November 29, 2013 FINDINGS: The lungs are  well-expanded. The interstitial markings are increased diffusely and more conspicuous than on the previous studies. Areas of near confluence are noted peripherally in the left mid and lower lung. There is no significant pleural effusion. The cardiac silhouette is mildly enlarged. There is calcification in the wall of the aortic arch. The trachea is midline. The bony thorax exhibits no acute abnormality. IMPRESSION: Pulmonary fibrotic changes, progressive since the previous studies. The most recent study available however is May 25, 2016. Thoracic aortic atherosclerosis. Electronically Signed  By: David  Martinique M.D.   On: 01/09/2017 10:27     Assessment & Plan:   PULMONARY FIBROSIS Possible progressive disease vs decompensation after recent PNA  Clinically he is stable on present regimen  But now oxygen dependent .  May add mucinex dm As needed   He has multiple comorbities including memory loss. - tried to discuss antifibrotic meds today but he said he wants to hold off for now .  Discussed we can revisit this on return.   Plan  Patient Instructions  Continue on CPAP with oxygen At bedtime  .  Do not drive if sleepy .  May continue on  BREO daily .  Chest xray today .  Decrease Oxygen 2l/m rest and 4l/m walking .  Mucinex DM Twice daily  As needed  Cough/congestion.  Follow up with Dr. Elsworth Soho  In 3 months and As needed   Please contact office for sooner follow up if symptoms do not improve or worsen or seek emergency care       OSA on CPAP Cont on CPAP At bedtime  .   Respiratory failure, chronic (HCC) Cont on O2 .      Rexene Edison, NP 01/09/2017

## 2017-01-09 NOTE — Assessment & Plan Note (Signed)
Cont on CPAP At bedtime  

## 2017-01-09 NOTE — Patient Instructions (Addendum)
Continue on CPAP with oxygen At bedtime  .  Do not drive if sleepy .  May continue on  BREO daily .  Chest xray today .  Decrease Oxygen 2l/m rest and 4l/m walking .  Mucinex DM Twice daily  As needed  Cough/congestion.  Follow up with Dr. [name]  In 3 months and As needed   Please contact office for sooner follow up if symptoms do not improve or worsen or seek emergency care

## 2017-01-09 NOTE — Assessment & Plan Note (Signed)
Cont on O2 .  

## 2017-01-09 NOTE — Assessment & Plan Note (Signed)
Possible progressive disease vs decompensation after recent PNA  Clinically he is stable on present regimen  But now oxygen dependent .  May add mucinex dm As needed   He has multiple comorbities including memory loss. - tried to discuss antifibrotic meds today but he said he wants to hold off for now .  Discussed we can revisit this on return.   Plan  Patient Instructions  Continue on CPAP with oxygen At bedtime  .  Do not drive if sleepy .  May continue on  BREO daily .  Chest xray today .  Decrease Oxygen 2l/m rest and 4l/m walking .  Mucinex DM Twice daily  As needed  Cough/congestion.  Follow up with Dr. [name]  In 3 months and As needed   Please contact office for sooner follow up if symptoms do not improve or worsen or seek emergency care

## 2017-01-19 ENCOUNTER — Telehealth: Payer: Self-pay | Admitting: Pulmonary Disease

## 2017-01-19 MED ORDER — PREDNISONE 20 MG PO TABS: 20.0000 mg | ORAL_TABLET | Freq: Every day | ORAL | 0 refills | Status: DC

## 2017-01-19 NOTE — Telephone Encounter (Signed)
Spoke with pt, he states his cough is worse and he is spitting up white sputum. Robitussin is not helping, he he SOB and on 4L of oxygen. Denies fever or chills, pain in the bottom of his chest. CY can you review for RA?  [facility]/[place]

## 2017-01-19 NOTE — Telephone Encounter (Signed)
Per CY verbally- order prednisone 20mg  daily x5d. Call back if no improvment in symptoms the beginning of next week. Pt is aware and voiced his understanding.  Rx has been sent to preferred pharmacy.  Nothing further needed.

## 2017-04-14 ENCOUNTER — Ambulatory Visit: Payer: Medicare HMO | Admitting: Adult Health

## 2017-04-14 ENCOUNTER — Encounter: Payer: Self-pay | Admitting: Adult Health

## 2017-04-14 DIAGNOSIS — J449 Chronic obstructive pulmonary disease, unspecified: Secondary | ICD-10-CM

## 2017-04-14 DIAGNOSIS — G4733 Obstructive sleep apnea (adult) (pediatric): Secondary | ICD-10-CM

## 2017-04-14 DIAGNOSIS — J849 Interstitial pulmonary disease, unspecified: Secondary | ICD-10-CM

## 2017-04-14 DIAGNOSIS — Z9989 Dependence on other enabling machines and devices: Secondary | ICD-10-CM | POA: Diagnosis not present

## 2017-04-14 NOTE — Patient Instructions (Addendum)
Continue on CPAP with oxygen At bedtime  .  Do not drive if sleepy .  Continue on BREO daily .  Mucinex DM Twice daily  As needed  Cough/congestion .  Continue on Oxygen 3l/m rest and 4l/m walking .  Mucinex DM Twice daily  As needed  Cough/congestion.  Follow up with Dr. Vassie LollAlva  In 3 months and As needed   Please contact office for sooner follow up if symptoms do not improve or worsen or seek emergency care

## 2017-04-14 NOTE — Assessment & Plan Note (Signed)
Cont on O2 .  

## 2017-04-14 NOTE — Assessment & Plan Note (Signed)
Stable w/ daily cough . O2 demands stable   Plan  Patient Instructions  Continue on CPAP with oxygen At bedtime  .  Do not drive if sleepy .  Continue on BREO daily .  Mucinex DM Twice daily  As needed  Cough/congestion .  Continue on Oxygen 3l/m rest and 4l/m walking .  Mucinex DM Twice daily  As needed  Cough/congestion.  Follow up with Dr. Vassie LollAlva  In 3 months and As needed   Please contact office for sooner follow up if symptoms do not improve or worsen or seek emergency care

## 2017-04-14 NOTE — Progress Notes (Signed)
$'@Patient'I$  ID: Bruce Winters, male    DOB: Sep 20, 1945, 72 y.o.   MRN: [id number]  Chief Complaint  Patient presents with  . 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    Significant tests/ events  HRCT 04/2016  ILD stablesince 2009, suggesting fibrotic phase NSIP Upper lobe predominant calcifications ? Silica exposure. Moderate centrilobular and paraseptal emphysema  01/2015 ESR 50, ANA 1: 160 homogenous, CCP neg> hypersens panel neg >>has seen rheumatology  PFTs 03/2015 >> no obs, FVC 85%, TLC 67 % (N2 wash out), DLCO 44% - 48% in 2009 PFTs 04/2016 FVC 84%, TLC 84%, DLCO 32% corrects for alveolar volume to 45%  04/14/2017 Follow up : ILD  And OSA  Pt returns for 3 month follow up . Says breathing is doing the same . No flare of cough or wheeizng. Remains on BREO daily. Has daily cough with intermittent thick white mucus. No fever, hemoptysis , or edema.  CXR in Oct 2018 showed chronic changes ILD/ increased markings.   Remains on Oxygen 3l/m rest and 4l/m walking . Feels oxygen helps his breathing .   He has OSA , wear on CPAP At bedtime  . Feels rested. Wears each night, never misses a night .   Allergies  Allergen Reactions  . Penicillins Anaphylaxis    Immunization History  Administered Date(s) Administered  . Influenza Split 11/27/2014  . Influenza, High Dose Seasonal PF 12/12/2016  . Influenza,inj,Quad PF,6+ Mos 01/27/2016  . Pneumococcal Conjugate-13 12/12/2016    Past Medical History:  Diagnosis Date  . Heart attack ([place])   .  Hyperlipidemia   . OSA (obstructive sleep apnea)    on CPAP     Tobacco History: Social History   Tobacco Use  Smoking Status Former Smoker  . Packs/day: 1.50  . Years: 15.00  . Pack years: 22.50  . Types: Cigarettes  . Last attempt to quit: 03/28/1994  . Years since quitting: 23.0  Smokeless Tobacco Never Used   Counseling given: Not Answered   Outpatient Encounter Medications as of 04/14/2017  Medication Sig  . aspirin 81 MG tablet Take 81 mg by mouth daily.  . cetirizine (ZYRTEC) 10 MG tablet Take 10 mg by mouth daily.  . Cyanocobalamin (VITAMIN B 12 PO) Take by mouth daily.  Marland Kitchen donepezil (ARICEPT) 10 MG tablet Take 10 mg by mouth at bedtime.  Marland Kitchen FLUoxetine (PROZAC) 20 MG tablet Take 20 mg by mouth daily.  . fluticasone (FLONASE) 50 MCG/ACT nasal spray Place 2 sprays into both nostrils daily.  . fluticasone furoate-vilanterol (BREO ELLIPTA) 200-25 MCG/INH AEPB Inhale 1 puff into the lungs daily.  . hydrochlorothiazide (HYDRODIURIL) 25 MG tablet Take 25 mg by mouth daily.  Marland Kitchen ibuprofen (ADVIL,MOTRIN) 600 MG tablet Take 600 mg by mouth 3 (three) times daily.  . Multiple Vitamins-Minerals (CENTRUM PO) Take 1 tablet by mouth daily.  . pantoprazole (PROTONIX) 40 MG tablet Take 40 mg by mouth 2 (two) times daily before a meal.  . Polyvinyl Alcohol-Povidone (REFRESH OP) Apply to eye.  . predniSONE (DELTASONE) 20 MG  tablet Take 1 tablet (20 mg total) by mouth daily with breakfast.  . simvastatin (ZOCOR) 20 MG tablet Take 20 mg by mouth daily.  . traZODone (DESYREL) 100 MG tablet Take 100 mg by mouth at bedtime as needed for sleep.    No facility-administered encounter medications on file as of 04/14/2017.      Review of Systems  Constitutional:   No  weight loss, night sweats,  Fevers, chills,  +fatigue, or  lassitude.  HEENT:   No headaches,  Difficulty swallowing,  Tooth/dental problems, or  Sore throat,                No sneezing, itching, ear ache,  +nasal congestion, post  nasal drip,   CV:  No chest pain,  Orthopnea, PND, swelling in lower extremities, anasarca, dizziness, palpitations, syncope.   GI  No heartburn, indigestion, abdominal pain, nausea, vomiting, diarrhea, change in bowel habits, loss of appetite, bloody stools.   Resp:  No chest wall deformity  Skin: no rash or lesions.  GU: no dysuria, change in color of urine, no urgency or frequency.  No flank pain, no hematuria   MS:  No joint pain or swelling.  No decreased range of motion.  No back pain.    Physical Exam  BP 128/80 (BP Location: Left Arm, Cuff Size: Normal)   Pulse 90   Ht _0  (1.88 m)   Wt 240 lb (108.9 kg)   SpO2 94%   BMI 30.81 kg/m   GEN: A/Ox3; pleasant , NAD, elderly on O2    HEENT:  [place]/AT,  EACs-clear, TMs-wnl, NOSE-clear, THROAT-clear, no lesions, no postnasal drip or exudate noted.   NECK:  Supple w/ fair ROM; no JVD; normal carotid impulses w/o bruits; no thyromegaly or nodules palpated; no lymphadenopathy.    RESP  BB crackles , . no accessory muscle use, no dullness to percussion  CARD:  RRR, no m/r/g, tr peripheral edema, pulses intact, no cyanosis or clubbing.  GI:   Soft & nt; nml bowel sounds; no organomegaly or masses detected.   Musco: Warm bil, no deformities or joint swelling noted.   Neuro: alert, no focal deficits noted.    Skin: Warm, no lesions or rashes    Lab Results:  CBC   BNP No results found for: BNP  ProBNP   Imaging: No results found.   Assessment & Plan:   COPD GOLD 0 Compensated w/.out flare  Add mucinex DM .Twice daily  As needed  Cough/congestion   Plan  Patient Instructions  Continue on CPAP with oxygen At bedtime  .  Do not drive if sleepy .  Continue on BREO daily .  Mucinex DM Twice daily  As needed  Cough/congestion .  Continue on Oxygen 3l/m rest and 4l/m walking .  Mucinex DM Twice daily  As needed  Cough/congestion.  Follow up with Dr. Elsworth Soho  In 3 months and As needed   Please contact office  for sooner follow up if symptoms do not improve or worsen or seek emergency care       ILD (interstitial lung disease) ([place]) Stable w/ daily cough . O2 demands stable   Plan  Patient Instructions  Continue on CPAP with oxygen At bedtime  .  Do not drive if sleepy .  Continue on BREO daily .  Mucinex DM Twice daily  As needed  Cough/congestion .  Continue on Oxygen 3l/m rest and 4l/m walking .  Mucinex DM Twice daily  As  needed  Cough/congestion.  Follow up with Dr. Elsworth Soho  In 3 months and As needed   Please contact office for sooner follow up if symptoms do not improve or worsen or seek emergency care        OSA on CPAP Cont on O2 .      Rexene Edison, NP 04/14/2017

## 2017-04-14 NOTE — Assessment & Plan Note (Signed)
Compensated w/.out flare  Add mucinex DM .Twice daily  As needed  Cough/congestion   Plan  Patient Instructions  Continue on CPAP with oxygen At bedtime  .  Do not drive if sleepy .  Continue on BREO daily .  Mucinex DM Twice daily  As needed  Cough/congestion .  Continue on Oxygen 3l/m rest and 4l/m walking .  Mucinex DM Twice daily  As needed  Cough/congestion.  Follow up with Dr. Vassie LollAlva  In 3 months and As needed   Please contact office for sooner follow up if symptoms do not improve or worsen or seek emergency care

## 2017-06-27 NOTE — Progress Notes (Signed)
Reviewed & agree with plan  

## 2017-07-31 ENCOUNTER — Encounter: Payer: Self-pay | Admitting: Pulmonary Disease

## 2017-07-31 ENCOUNTER — Ambulatory Visit: Payer: Medicare HMO | Admitting: Pulmonary Disease

## 2017-07-31 DIAGNOSIS — J849 Interstitial pulmonary disease, unspecified: Secondary | ICD-10-CM | POA: Diagnosis not present

## 2017-07-31 DIAGNOSIS — G4733 Obstructive sleep apnea (adult) (pediatric): Secondary | ICD-10-CM | POA: Diagnosis not present

## 2017-07-31 DIAGNOSIS — J9611 Chronic respiratory failure with hypoxia: Secondary | ICD-10-CM | POA: Diagnosis not present

## 2017-07-31 DIAGNOSIS — Z9989 Dependence on other enabling machines and devices: Secondary | ICD-10-CM

## 2017-07-31 DIAGNOSIS — R0602 Shortness of breath: Secondary | ICD-10-CM | POA: Diagnosis not present

## 2017-07-31 NOTE — Assessment & Plan Note (Addendum)
Continue 3 L at rest but needs 5 L on exertion. Referral to pulmonary rehab

## 2017-07-31 NOTE — Assessment & Plan Note (Signed)
Managed by the [facility]  Weight loss encouraged, compliance with goal of at least 4-6 hrs every night is the expectation. Advised against medications with sedative side effects Cautioned against driving when sleepy - understanding that sleepiness will vary on a day to day basis

## 2017-07-31 NOTE — Patient Instructions (Signed)
Schedule PFTs and high-resolution CT of the chest  Prednisone 10 mg tabs  Take 2 tabs daily with food x 2 weeks , then 1 tab daily with food

## 2017-07-31 NOTE — Progress Notes (Signed)
   Subjective:    Patient ID: Bruce Winters, male    DOB: [birth year], 72 y.o.   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   He had an episode of pneumonia 09/2016 and was admitted to [facility] and discharged on oxygen.  Since then  he uses about 3 L of oxygen at rest and 4 L when walking. he is more or less stable, but does report a chronic cough nonproductive and increased tiredness. He has his CPAP to the [place] and has noticed increased tiredness in spite of good compliance  He has mild cognitive impairment and is maintained on Aricept   Significant tests/ events  HRCT 04/2016  ILD stablesince 2009, suggesting fibrotic phase NSIP Upper lobe predominant calcifications ? Silica exposure. Moderate centrilobular and paraseptal emphysema  01/2015 ESR 50, ANA 1: 160 homogenous, CCP neg> hypersens panel neg >>has seen rheumatology  PFTs 03/2015 >> no obs, FVC 85%, TLC 67 % (N2 wash out), DLCO 44% - 48% in 2009 PFTs 04/2016 FVC 84%, TLC 84%, DLCO 32% corrects for alveolar volume to 45%   Past Medical History:  Diagnosis Date  . Heart attack ([place])   . Hyperlipidemia   . OSA (obstructive sleep apnea)    on CPAP       Review of Systems neg for any significant sore throat, dysphagia, itching, sneezing, nasal congestion or excess/ purulent secretions, fever, chills, sweats, unintended wt loss, pleuritic or exertional cp, hempoptysis, orthopnea pnd or change in chronic leg swelling. Also denies presyncope, palpitations, heartburn, abdominal pain, nausea, vomiting, diarrhea or change in bowel or urinary habits, dysuria,hematuria, rash, arthralgias, visual complaints, headache, numbness weakness or ataxia.     Objective:   Physical Exam  Gen. Pleasant, obese, in no  distress ENT - no lesions, no post nasal drip Neck: No JVD, no thyromegaly, no carotid bruits Lungs: no use of accessory muscles, no dullness to percussion, fine bibasal rales no rhonchi  Cardiovascular: Rhythm regular, heart sounds  normal, no murmurs or gallops, no peripheral edema Musculoskeletal: No deformities, no cyanosis or clubbing , no tremors        Assessment & Plan:

## 2017-07-31 NOTE — Assessment & Plan Note (Signed)
Schedule PFTs and high-resolution CT of the chest He appears to be stable except for worsening cough and tiredness that persists in spite of CPAP use Oxygenation does not appear to be worse when walking but we will still try empiric treatment with steroids for a slightly longer duration to see if this helps him with his cough Prednisone 10 mg tabs  Take 2 tabs daily with food x 2 weeks , then 1 tab daily with food  He is very clear that he does not want a surgical biopsy. Reason DLCO is decreased as because he also has apical emphysema based on his long history of smoking

## 2017-08-01 ENCOUNTER — Telehealth (HOSPITAL_COMMUNITY): Payer: Self-pay

## 2017-08-01 NOTE — Telephone Encounter (Signed)
Referral received. Will need new order as the Services is under COPD with a DX of SOB. Will call [facility] to request new referral.

## 2017-08-10 ENCOUNTER — Ambulatory Visit (INDEPENDENT_AMBULATORY_CARE_PROVIDER_SITE_OTHER)
Admission: RE | Admit: 2017-08-10 | Discharge: 2017-08-10 | Disposition: A | Discharge status: Home / Self Care | Payer: Medicare HMO | Source: Ambulatory Visit | Attending: Pulmonary Disease | Admitting: Pulmonary Disease

## 2017-08-10 DIAGNOSIS — R0602 Shortness of breath: Secondary | ICD-10-CM | POA: Diagnosis not present

## 2017-08-15 ENCOUNTER — Ambulatory Visit: Payer: Medicare HMO | Admitting: Pulmonary Disease

## 2017-08-15 ENCOUNTER — Other Ambulatory Visit (INDEPENDENT_AMBULATORY_CARE_PROVIDER_SITE_OTHER): Payer: Medicare HMO

## 2017-08-15 ENCOUNTER — Ambulatory Visit (INDEPENDENT_AMBULATORY_CARE_PROVIDER_SITE_OTHER): Payer: Medicare HMO | Admitting: Pulmonary Disease

## 2017-08-15 ENCOUNTER — Encounter: Payer: Self-pay | Admitting: Pulmonary Disease

## 2017-08-15 DIAGNOSIS — J849 Interstitial pulmonary disease, unspecified: Secondary | ICD-10-CM | POA: Diagnosis not present

## 2017-08-15 DIAGNOSIS — R0602 Shortness of breath: Secondary | ICD-10-CM

## 2017-08-15 DIAGNOSIS — Z9989 Dependence on other enabling machines and devices: Secondary | ICD-10-CM | POA: Diagnosis not present

## 2017-08-15 DIAGNOSIS — G4733 Obstructive sleep apnea (adult) (pediatric): Secondary | ICD-10-CM | POA: Diagnosis not present

## 2017-08-15 LAB — PULMONARY FUNCTION TEST
DL/VA % PRED: 36 %
DL/VA: 1.73 ml/min/mmHg/L
DLCO unc % pred: 25 %
DLCO unc: 9.54 ml/min/mmHg
FEF 25-75 POST: 3.84 L/s
FEF 25-75 Pre: 3.06 L/sec
FEF2575-%CHANGE-POST: 25 %
FEF2575-%PRED-POST: 139 %
FEF2575-%PRED-PRE: 110 %
FEV1-%CHANGE-POST: 3 %
FEV1-%Pred-Post: 90 %
FEV1-%Pred-Pre: 87 %
FEV1-PRE: 3.23 L
FEV1-Post: 3.34 L
FEV1FVC-%CHANGE-POST: 1 %
FEV1FVC-%PRED-PRE: 109 %
FEV6-%Change-Post: 2 %
FEV6-%Pred-Post: 86 %
FEV6-%Pred-Pre: 84 %
FEV6-Post: 4.11 L
FEV6-Pre: 4.02 L
FEV6FVC-%Change-Post: 0 %
FEV6FVC-%Pred-Post: 105 %
FEV6FVC-%Pred-Pre: 105 %
FVC-%CHANGE-POST: 2 %
FVC-%PRED-POST: 81 %
FVC-%Pred-Pre: 79 %
FVC-POST: 4.11 L
FVC-PRE: 4.02 L
POST FEV1/FVC RATIO: 81 %
PRE FEV1/FVC RATIO: 80 %
PRE FEV6/FVC RATIO: 100 %
Post FEV6/FVC ratio: 100 %
RV % pred: 15 %
RV: 0.41 L
TLC % PRED: 53 %
TLC: 4.17 L

## 2017-08-15 LAB — HEPATIC FUNCTION PANEL
ALBUMIN: 3.8 g/dL (ref 3.5–5.2)
ALT: 13 U/L (ref 0–53)
AST: 13 U/L (ref 0–37)
Alkaline Phosphatase: 74 U/L (ref 39–117)
Bilirubin, Direct: 0 mg/dL (ref 0.0–0.3)
TOTAL PROTEIN: 6.9 g/dL (ref 6.0–8.3)
Total Bilirubin: 0.3 mg/dL (ref 0.2–1.2)

## 2017-08-15 NOTE — Progress Notes (Signed)
PFT done today. 

## 2017-08-15 NOTE — Assessment & Plan Note (Signed)
Weight loss encouraged, compliance with goal of at least 4-6 hrs every night is the expectation. Advised against medications with sedative side effects Cautioned against driving when sleepy - understanding that sleepiness will vary on a day to day basis  

## 2017-08-15 NOTE — Assessment & Plan Note (Signed)
Continue 3 L at rest and 4 L on exertion

## 2017-08-15 NOTE — Addendum Note (Signed)
Addended by: Miguel Aschoff on: 08/15/2017 02:47 PM   Modules accepted: Orders

## 2017-08-15 NOTE — Assessment & Plan Note (Addendum)
HRCT now favors UIP  will start  medication called ESBRIET to the liver worsening of your fibrosis in the lung. Not a good candidate for Ofev since he has diarrhea requiring imodium already  We discussed side effects of nausea, photosensitivity and liver toxicity  LFTs today and every month after starting new medication Will repeat ENA panel  Greater than 50% time x  40 mins was spent in counseling and coordination of care with the patient

## 2017-08-15 NOTE — Progress Notes (Signed)
   Subjective:    Patient ID: Bruce Winters, male    DOB: 04/02/1945, 72 y.o.   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          He has mild cognitive impairment and is maintained on Aricept  He is maintained on 3 L of oxygen at rest and 4 L on exertion since 08/2016 after hospitalization for "pneumonia".  His breathing is stable since last visit. We discussed CT and PFTs today  He is compliant with CPAP, he obtains this through the [place]  He has diarrhea and takes Imodium, he also has photosensitivity and wears full sleeve shirts all the time    Significant tests/ events  HRCT 04/2016  ILD stablesince 2009, suggesting fibrotic phase NSIP Upper lobe predominant calcifications ? Silica exposure. Moderate centrilobular and paraseptal emphysema  07/2017 HRCT >> UIP pattern, Scattered calcified and noncalcified micronodules  01/2015 ESR 50, ANA 1: 160 homogenous, CCP neg> hypersens panel neg >>has seen rheumatology  PFTs 03/2015 >> no obs, FVC 85%, TLC 67 % (N2 wash out), DLCO 44% - 48% in 2009 PFTs 04/2016 FVC 84%, TLC 84%, DLCO 32% corrects for alveolar volume to 45%  PFTs 07/2017 >> ratio 80, FVC 79%, TLC 58%, DLCO 25% >> slight drop   Past Medical History:  Diagnosis Date  . Heart attack ([place])   . Hyperlipidemia   . OSA (obstructive sleep apnea)    on CPAP      Review of Systems neg for any significant sore throat, dysphagia, itching, sneezing, nasal congestion or excess/ purulent secretions, fever, chills, sweats, unintended wt loss, pleuritic or exertional cp, hempoptysis, orthopnea pnd or change in chronic leg swelling.   Also denies presyncope, palpitations, heartburn, abdominal pain, nausea, vomiting, diarrhea or change in bowel or urinary habits,  dysuria,hematuria, rash, arthralgias, visual complaints, headache, numbness weakness or ataxia.     Objective:   Physical Exam  Gen. Pleasant, obese, in no distress ENT - no lesions, no post nasal drip Neck: No JVD, no thyromegaly, no carotid bruits Lungs: no use of accessory muscles, no dullness to percussion, bibasal rales no rhonchi  Cardiovascular: Rhythm regular, heart sounds  normal, no murmurs or gallops, no peripheral edema Musculoskeletal: No deformities, no cyanosis or clubbing , no tremors       Assessment & Plan:

## 2017-08-15 NOTE — Patient Instructions (Signed)
We will start you on medication called ESBRIET to the liver worsening of your fibrosis in the lung.  We discussed side effects of nausea, photosensitivity and liver toxicity  Blood work today and every month after starting new medication

## 2017-08-16 ENCOUNTER — Telehealth: Payer: Self-pay | Admitting: Pulmonary Disease

## 2017-08-16 NOTE — Telephone Encounter (Signed)
Notes recorded by Oretha Milch, MD on 08/16/2017 at 10:32 AM EDT Liver tests okay  Pt is aware of results and voiced his understanding. Nothing further is needed.

## 2017-08-17 LAB — ANA+ENA+DNA/DS+SCL 70+SJOSSA/B
ANA Titer 1: NEGATIVE
ENA RNP AB: 0.2 AI (ref 0.0–0.9)
ENA SM Ab Ser-aCnc: <0.2 AI (ref 0.0–0.9)
Scleroderma SCL-70: <0.2 AI (ref 0.0–0.9)
dsDNA Ab: 1 IU/mL (ref 0–9)

## 2017-08-17 LAB — ANTI-JO 1 ANTIBODY, IGG: Anti JO-1: <0.2 AI (ref 0.0–0.9)

## 2017-08-22 ENCOUNTER — Telehealth: Payer: Self-pay | Admitting: Pulmonary Disease

## 2017-08-22 DIAGNOSIS — R0602 Shortness of breath: Secondary | ICD-10-CM

## 2017-08-22 NOTE — Telephone Encounter (Signed)
Received a fax from [facility] stating that after reviewing the patient's insurance information, they have determined it would be best to send the RX to [facility].   Received a fax from [facility] asking for clarification on the Esbriet RX.   RX for Bruce Winters is written as Take 1 tablet 3 times daily for 1 week. Then take 2 tablets 3 times a day for a week. Then take 3 tablets 3 times daily with meals.   RA, please advise if the above is the correct fax. It appears this was sent while I was out last week and I do not have the original RX. Thanks!

## 2017-08-22 NOTE — Telephone Encounter (Signed)
Spoke with Bruce Winters. She stated that the patient needed a new pulm rehab order with SOB and respiratory care services selected.   Advised her that I would place the order today. She verbalized understanding. Nothing further needed at time of call.

## 2017-08-22 NOTE — Telephone Encounter (Signed)
RX has been faxed back to [facility]. Will close this encounter.

## 2017-08-22 NOTE — Telephone Encounter (Signed)
That is correct 

## 2017-08-30 ENCOUNTER — Telehealth: Payer: Self-pay | Admitting: Pulmonary Disease

## 2017-08-30 NOTE — Telephone Encounter (Signed)
Received a fax for PA  Esbriet from [employer]. Paper work has been filled out will fax off today. Nothing further needed at this time.

## 2017-09-05 ENCOUNTER — Telehealth (HOSPITAL_COMMUNITY): Payer: Self-pay

## 2017-09-05 NOTE — Telephone Encounter (Signed)
Attempted to call patient in regards to Pulmonary Rehab. Patients insurance is out of network - lm on vm

## 2017-09-07 ENCOUNTER — Telehealth (HOSPITAL_COMMUNITY): Payer: Self-pay

## 2017-09-07 NOTE — Telephone Encounter (Signed)
Called pt and adv him that per his insurance, we (Pulmonary Rehab Program) are out of network and program is not cover. Pt states he will f/u with his insurance

## 2017-09-08 ENCOUNTER — Ambulatory Visit: Payer: Medicare HMO | Admitting: Pulmonary Disease

## 2017-09-08 ENCOUNTER — Telehealth (HOSPITAL_COMMUNITY): Payer: Self-pay

## 2017-09-08 NOTE — Telephone Encounter (Signed)
Pt insurance is active and benefits verified through [facility]. Co-pay $10.00, DED $0.00/$0.00 met, out of pocket $3,400.00/$743.19 met, co-insurance 0%. No pre-authorization require. [employer]/Jane, 09/08/17 @ 12:12 OM, ZBM#[id number]

## 2017-09-08 NOTE — Telephone Encounter (Signed)
Called and spoke with pt in regards to PER, sch orientation on 09/27/17 @ 9:30 AM. Pt would attend the 1:30 PM class. Went over insurance with pt and pt ver'd understanding. Pensions consultantMailed package.

## 2017-09-27 ENCOUNTER — Encounter (HOSPITAL_COMMUNITY): Payer: Medicare HMO

## 2017-10-16 ENCOUNTER — Ambulatory Visit: Payer: Medicare HMO | Admitting: Pulmonary Disease

## 2017-10-16 ENCOUNTER — Telehealth: Payer: Self-pay | Admitting: Pulmonary Disease

## 2017-10-16 ENCOUNTER — Encounter: Payer: Self-pay | Admitting: Pulmonary Disease

## 2017-10-16 DIAGNOSIS — J849 Interstitial pulmonary disease, unspecified: Secondary | ICD-10-CM

## 2017-10-16 DIAGNOSIS — J9611 Chronic respiratory failure with hypoxia: Secondary | ICD-10-CM

## 2017-10-16 DIAGNOSIS — F0390 Unspecified dementia without behavioral disturbance: Secondary | ICD-10-CM | POA: Diagnosis not present

## 2017-10-16 NOTE — Progress Notes (Signed)
   Subjective:    Patient ID: Bruce Winters, male    DOB: [birth year], 72 y.o.   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  However his cognitive impairment seems to be worse because he does not really remember whether he got this medication or not.  He states that he is taking a medicine 3 times a day, he keeps his own medication out and pillboxes.  His dyspnea is not any worse.  He does seem that he saw a pulmonologist at [facility] on 7/18, I have reviewed the notes in care everywhere, not sure why he was referred.  He seems to think that he was referred for pulmonary rehab since he had a co-pay at [facility] rehab and the [place] told him that he could get this done for free  He continues to be compliant with his CPAP and his only complaint is cough Significant tests/ events  HRCT 04/2016  ILD stablesince 2009, suggesting fibrotic phase NSIP Upper lobe predominant calcifications ? Silica exposure. Moderate centrilobular and paraseptal emphysema         07/2017 HRCT >> UIP pattern, Scattered calcified and noncalcified micronodules  01/2015 ESR 50, ANA 1: 160 homogenous, CCP neg> hypersens panel neg >>has seen rheumatology  PFTs 03/2015 >> no obs, FVC 85%, TLC 67 % (N2 wash out), DLCO 44% - 48% in 2009 PFTs 04/2016 FVC 84%, TLC 84%, DLCO 32% corrects for alveolar volume to 45%  PFTs 07/2017 >> ratio 80, FVC 79%, TLC 58%, DLCO 25% >> slight drop  Past Medical History:  Diagnosis Date  . Heart attack ([place])   . Hyperlipidemia   . OSA (obstructive sleep  apnea)    on CPAP       Review of Systems neg for any significant sore throat, dysphagia, itching, sneezing, nasal congestion or excess/ purulent secretions, fever, chills, sweats, unintended wt loss, pleuritic or exertional cp, hempoptysis, orthopnea pnd or change in chronic leg swelling. Also denies presyncope, palpitations, heartburn, abdominal pain, nausea, vomiting, diarrhea or change in bowel or urinary habits, dysuria,hematuria, rash, arthralgias, visual complaints, headache, numbness weakness or ataxia.     Objective:   Physical Exam   Gen. Pleasant, well-nourished, in no distress ENT - no thrush, no post nasal drip Neck: No JVD, no thyromegaly, no carotid bruits Lungs: no use of accessory muscles, no dullness to percussion, bibasa rales no rhonchi  Cardiovascular: Rhythm regular, heart sounds  normal, no murmurs or gallops, no peripheral edema Musculoskeletal: No deformities, no cyanosis or clubbing          Assessment & Plan:

## 2017-10-16 NOTE — Telephone Encounter (Signed)
Spoke with a rep in regards to Mr. Bruce Winters received his Esbriet medication. I was informed that the medication still needs a PA and he needs to contact [employer] to schedule a delivery date. Patient informed the pharmacy that he would not be receiving any medications from a mail order pharmacy.   Upon looking in his chart, a PA was started during the week of May 20th. I was out that week and the form was not scanned into his chart.   I have requested that [employer] re-fax the PA form to me. Will await form.

## 2017-10-16 NOTE — Patient Instructions (Signed)
Bring your daughter with you on your next visit.  We have sent that prescription for new medication for your lung fibrosis to specialty pharmacy.  [facility]

## 2017-10-16 NOTE — Assessment & Plan Note (Addendum)
Unfortunately his cognitive impairment seems to be worsening.  Not sure he is the best candidate for anti-fibrotic therapy but his fibrosis is also been worsening gradually.  Esbriet will certainly impose a pill burden on him but will try this for short period of time and see how he tolerates. Have asked him to bring his daughter with him on his next visit so that we can discuss goals of care in detail  Addendum-we called the specialty pharmacy and it seems that they contacted him to supply areas.  And he told him that he would not be getting any indications from mail-order pharmacy.  Hence this order was canceled.  Again it is important that he get a family member with him for his next visit so that we can discuss how to proceed.  He will clarify with his PCP if he wants to keep his further follow-up with us or at [facility]

## 2017-10-16 NOTE — Assessment & Plan Note (Signed)
Continue 3 L at rest and 4 L with exertion. Okay to start pulmonary re[facility] since that is his preference

## 2017-10-16 NOTE — Assessment & Plan Note (Signed)
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  Prescription has been sent for Esbriet to specialty pharmacy.   He has concerns about his co-pay for [facility]. Alternatively we can enroll him in research study which would pay for pulmonary rehab.

## 2017-10-17 ENCOUNTER — Telehealth (HOSPITAL_COMMUNITY): Payer: Self-pay

## 2017-10-19 NOTE — Telephone Encounter (Signed)
Per RA, check to see if patient wants to follow up with him or see the pulmonologist [facility] before signing RX. Left message for patient to call back.

## 2017-10-25 ENCOUNTER — Ambulatory Visit (HOSPITAL_COMMUNITY): Payer: Medicare HMO

## 2018-04-26 ENCOUNTER — Encounter: Payer: Self-pay | Admitting: Cardiology

## 2018-04-26 ENCOUNTER — Ambulatory Visit (INDEPENDENT_AMBULATORY_CARE_PROVIDER_SITE_OTHER): Payer: Medicare HMO | Admitting: Cardiology

## 2018-04-26 VITALS — BP 142/80 | HR 78 | Ht 73.0 in | Wt 253.0 lb

## 2018-04-26 DIAGNOSIS — I25119 Atherosclerotic heart disease of native coronary artery with unspecified angina pectoris: Secondary | ICD-10-CM | POA: Insufficient documentation

## 2018-04-26 DIAGNOSIS — J449 Chronic obstructive pulmonary disease, unspecified: Secondary | ICD-10-CM

## 2018-04-26 DIAGNOSIS — J8410 Pulmonary fibrosis, unspecified: Secondary | ICD-10-CM | POA: Diagnosis not present

## 2018-04-26 DIAGNOSIS — I10 Essential (primary) hypertension: Secondary | ICD-10-CM

## 2018-04-26 DIAGNOSIS — E785 Hyperlipidemia, unspecified: Secondary | ICD-10-CM | POA: Insufficient documentation

## 2018-04-26 NOTE — Progress Notes (Signed)
Cardiology Office Note:    Date:  04/26/2018   ID:  Bruce Winters, DOB 07-28-1945, MRN [id number]  PCP:  Devra DoppHowell, Tamieka, MD  Cardiologist:  Gypsy Balsamobert Sheria Rosello, MD    Referring MD: Devra DoppHowell, Tamieka, MD   Chief Complaint  Patient presents with  . Follow-up  Doing well  History of Present Illness:    Bruce Winters is a 73 y.o. male with coronary artery disease.  He is a patient of Dr. Donnie Ahoilley.  In 2009 he suffered from myocardial infarction and stent to LAD was implanted.  Since that time he is doing well the situation is complicated by his pulmonary fibrosis.  Chief complaint is progressive exertional shortness of breath.  Denies have any chest pain tightness squeezing pressure burning chest.  Past Medical History:  Diagnosis Date  . Heart attack ([facility])   . Hyperlipidemia   . OSA (obstructive sleep apnea)    on CPAP     Past Surgical History:  Procedure Laterality Date  . ANGIOPLASTY    . CHOLECYSTECTOMY    . COLON SURGERY      Current Medications: Current Meds  Medication Sig  . aspirin 81 MG tablet Take 81 mg by mouth daily.  Marland Kitchen. ezetimibe (ZETIA) 10 MG tablet TK 1 T PO D  . FLUoxetine (PROZAC) 20 MG tablet Take 20 mg by mouth daily.  . fluticasone (FLONASE) 50 MCG/ACT nasal spray Place 2 sprays into both nostrils daily.  Marland Kitchen. loperamide (IMODIUM) 2 MG capsule Take by mouth.  . memantine (NAMENDA) 10 MG tablet Take 20 mg by mouth at bedtime.  . Multiple Vitamins-Minerals (CENTRUM PO) Take 1 tablet by mouth daily.  . Polyvinyl Alcohol-Povidone (REFRESH OP) Apply to eye.  . traZODone (DESYREL) 100 MG tablet Take 100 mg by mouth at bedtime as needed for sleep.      Allergies:   Penicillins   Social History   Socioeconomic History  . Marital status: Widowed    Spouse name: Not on file  . Number of children: Not on file  . Years of education: Not on file  . Highest education level: Not on file  Occupational History  . Not on file  Social Needs  . Financial  resource strain: Not on file  . Food insecurity:    Worry: Not on file    Inability: Not on file  . Transportation needs:    Medical: Not on file    Non-medical: Not on file  Tobacco Use  . Smoking status: Former Smoker    Packs/day: 1.50    Years: 15.00    Pack years: 22.50    Types: Cigarettes    Last attempt to quit: 03/28/1994    Years since quitting: 24.0  . Smokeless tobacco: Never Used  Substance and Sexual Activity  . Alcohol use: No    Alcohol/week: 0.0 standard drinks  . Drug use: No  . Sexual activity: Not on file  Lifestyle  . Physical activity:    Days per week: Not on file    Minutes per session: Not on file  . Stress: Not on file  Relationships  . Social connections:    Talks on phone: Not on file    Gets together: Not on file    Attends religious service: Not on file    Active member of club or organization: Not on file    Attends meetings of clubs or organizations: Not on file    Relationship status: Not on file  Other Topics  Concern  . Not on file  Social History Narrative  . Not on file     Family History: The patient's family history is not on file. ROS:   Please see the history of present illness.    All 14 point review of systems negative except as described per history of present illness  EKGs/Labs/Other Studies Reviewed:      Recent Labs: 08/15/2017: ALT 13  Recent Lipid Panel    Component Value Date/Time   CHOL 195 01/02/2011 0532   TRIG 157 (H) 01/02/2011 0532   HDL 37 (L) 01/02/2011 0532   CHOLHDL 5.3 01/02/2011 0532   VLDL 31 01/02/2011 0532   LDLCALC 127 (H) 01/02/2011 0532    Physical Exam:    VS:  BP (!) 142/80   Pulse 78   Ht 6\' 1"  (1.854 m)   Wt 253 lb (114.8 kg)   SpO2 95% Comment: 3lt O2  BMI 33.38 kg/m     Wt Readings from Last 3 Encounters:  04/26/18 253 lb (114.8 kg)  10/16/17 245 lb 3.2 oz (111.2 kg)  08/15/17 240 lb (108.9 kg)     GEN:  Well nourished, well developed in no acute distress HEENT:  Normal NECK: No JVD; No carotid bruits LYMPHATICS: No lymphadenopathy CARDIAC: RRR, no murmurs, no rubs, no gallops RESPIRATORY:  Clear to auscultation without rales, wheezing or rhonchi  ABDOMEN: Soft, non-tender, non-distended MUSCULOSKELETAL:  No edema; No deformity  SKIN: Warm and dry LOWER EXTREMITIES: no swelling NEUROLOGIC:  Alert and oriented x 3 PSYCHIATRIC:  Normal affect   ASSESSMENT:    1. COPD GOLD 0   2. Coronary artery disease involving native coronary artery of native heart with angina pectoris (HCC)   3. Essential hypertension   4. PULMONARY FIBROSIS   5. Dyslipidemia    PLAN:    In order of problems listed above:  1. Coronary artery disease status post myocardial infarction 2009.  I will ask him to have an EKG today.  Echocardiogram will be done as well to assess left ventricular ejection fraction.  Likely he seems to be doing well he is asymptomatic. 2. Essential hypertension blood pressure well controlled continue present management. 3. Dyslipidemia last fasting lipid profile done in summer looks excellent. 4. Pulmonary fibrosis: Followed by internal medicine team as well as pulmonary.   Medication Adjustments/Labs and Tests Ordered: Current medicines are reviewed at length with the patient today.  Concerns regarding medicines are outlined above.  No orders of the defined types were placed in this encounter.  Medication changes: No orders of the defined types were placed in this encounter.   Signed, Georgeanna Lea, MD, [facility] 04/26/2018 9:57 AM    [facility]

## 2018-04-26 NOTE — Patient Instructions (Signed)
Medication Instructions:  Your physician recommends that you continue on your current medications as directed. Please refer to the Current Medication list given to you today.  If you need a refill on your cardiac medications before your next appointment, please call your pharmacy.   Lab work: None  If you have labs (blood work) drawn today and your tests are completely normal, you will receive your results only by: Marland Kitchen MyChart Message (if you have MyChart) OR . A paper copy in the mail If you have any lab test that is abnormal or we need to change your treatment, we will call you to review the results.  Testing/Procedures: You had an EKG today.   Your physician has requested that you have an echocardiogram. Echocardiography is a painless test that uses sound waves to create images of your heart. It provides your doctor with information about the size and shape of your heart and how well your heart's chambers and valves are working. This procedure takes approximately one hour. There are no restrictions for this procedure.  Follow-Up: At [facility], you and your health needs are our priority.  As part of our continuing mission to provide you with exceptional heart care, we have created designated Provider Care Teams.  These Care Teams include your primary Cardiologist (physician) and Advanced Practice Providers (APPs -  Physician Assistants and Nurse Practitioners) who all work together to provide you with the care you need, when you need it. You will need a follow up appointment in 4 months.  Please call our office 2 months in advance to schedule this appointment.      Echocardiogram An echocardiogram is a procedure that uses painless sound waves (ultrasound) to produce an image of the heart. Images from an echocardiogram can provide important information about:  Signs of coronary artery disease (CAD).  Aneurysm detection. An aneurysm is a weak or damaged part of an artery wall that  bulges out from the normal force of blood pumping through the body.  Heart size and shape. Changes in the size or shape of the heart can be associated with certain conditions, including heart failure, aneurysm, and CAD.  Heart muscle function.  Heart valve function.  Signs of a past heart attack.  Fluid buildup around the heart.  Thickening of the heart muscle.  A tumor or infectious growth around the heart valves. Tell a health care provider about:  Any allergies you have.  All medicines you are taking, including vitamins, herbs, eye drops, creams, and over-the-counter medicines.  Any blood disorders you have.  Any surgeries you have had.  Any medical conditions you have.  Whether you are pregnant or may be pregnant. What are the risks? Generally, this is a safe procedure. However, problems may occur, including:  Allergic reaction to dye (contrast) that may be used during the procedure. What happens before the procedure? No specific preparation is needed. You may eat and drink normally. What happens during the procedure?   An IV tube may be inserted into one of your veins.  You may receive contrast through this tube. A contrast is an injection that improves the quality of the pictures from your heart.  A gel will be applied to your chest.  A wand-like tool (transducer) will be moved over your chest. The gel will help to transmit the sound waves from the transducer.  The sound waves will harmlessly bounce off of your heart to allow the heart images to be captured in real-time motion. The images  will be recorded on a computer. The procedure may vary among health care providers and hospitals. What happens after the procedure?  You may return to your normal, everyday life, including diet, activities, and medicines, unless your health care provider tells you not to do that. Summary  An echocardiogram is a procedure that uses painless sound waves (ultrasound) to produce  an image of the heart.  Images from an echocardiogram can provide important information about the size and shape of your heart, heart muscle function, heart valve function, and fluid buildup around your heart.  You do not need to do anything to prepare before this procedure. You may eat and drink normally.  After the echocardiogram is completed, you may return to your normal, everyday life, unless your health care provider tells you not to do that. This information is not intended to replace advice given to you by your health care provider. Make sure you discuss any questions you have with your health care provider. Document Released: 03/11/2000 Document Revised: 04/16/2016 Document Reviewed: 04/16/2016 Elsevier Interactive Patient Education  2019 [employer].

## 2018-05-09 ENCOUNTER — Ambulatory Visit (HOSPITAL_BASED_OUTPATIENT_CLINIC_OR_DEPARTMENT_OTHER)
Admission: RE | Admit: 2018-05-09 | Discharge: 2018-05-09 | Disposition: A | Discharge status: Home / Self Care | Payer: Medicare HMO | Source: Ambulatory Visit | Attending: Cardiology | Admitting: Cardiology

## 2018-05-09 DIAGNOSIS — I10 Essential (primary) hypertension: Secondary | ICD-10-CM | POA: Diagnosis not present

## 2018-05-09 DIAGNOSIS — I25119 Atherosclerotic heart disease of native coronary artery with unspecified angina pectoris: Secondary | ICD-10-CM | POA: Diagnosis not present

## 2018-05-09 MED ORDER — PERFLUTREN LIPID MICROSPHERE
1.0000 mL | INTRAVENOUS | Status: AC | PRN
  Administered 2018-05-09: 4 mL via INTRAVENOUS
  Filled 2018-05-09: qty 10

## 2018-05-09 NOTE — Progress Notes (Signed)
  Echocardiogram 2D Echocardiogram has been performed.  Krishauna Schatzman T Mikahla Wisor 05/09/2018, 4:11 PM

## 2018-12-19 ENCOUNTER — Telehealth: Payer: Self-pay | Admitting: Pulmonary Disease

## 2018-12-19 NOTE — Telephone Encounter (Signed)
I called [employer] and left a detailed msg for Butch Penny letting her know that we will be reaching out to the pt    I Called the pt x 2- first attempt, someone answered and then hung up.. 2nd attempt recording states "all circuits are busy, try again later"  [facility]

## 2018-12-20 NOTE — Telephone Encounter (Signed)
Called spoke with patient states that with increased activity his o2 sats drop to upper 70s low 80s. He said he wants to come in for an appointment on Monday.    Patient states he is out of an inhaler "you put in your mouth and it comes out" the [place] said he needed an appointment with pulmonary.   Patient also states he is on a nebulizer every morning, we dont' have records of this either. Patient instructed to have all meds with him at visit.    He is scheduled with Wyn Quaker Monday 12/24/18 at 0900

## 2018-12-24 ENCOUNTER — Ambulatory Visit: Payer: Medicare HMO | Admitting: Pulmonary Disease

## 2018-12-24 ENCOUNTER — Encounter: Payer: Self-pay | Admitting: Pulmonary Disease

## 2018-12-24 ENCOUNTER — Telehealth: Payer: Self-pay | Admitting: Pulmonary Disease

## 2018-12-24 ENCOUNTER — Other Ambulatory Visit: Payer: Self-pay

## 2018-12-24 VITALS — BP 142/80 | HR 87 | Ht 74.0 in | Wt 245.0 lb

## 2018-12-24 DIAGNOSIS — J449 Chronic obstructive pulmonary disease, unspecified: Secondary | ICD-10-CM

## 2018-12-24 DIAGNOSIS — Z9989 Dependence on other enabling machines and devices: Secondary | ICD-10-CM

## 2018-12-24 DIAGNOSIS — F0390 Unspecified dementia without behavioral disturbance: Secondary | ICD-10-CM

## 2018-12-24 DIAGNOSIS — J9611 Chronic respiratory failure with hypoxia: Secondary | ICD-10-CM

## 2018-12-24 DIAGNOSIS — Z7189 Other specified counseling: Secondary | ICD-10-CM

## 2018-12-24 DIAGNOSIS — J849 Interstitial pulmonary disease, unspecified: Secondary | ICD-10-CM | POA: Diagnosis not present

## 2018-12-24 DIAGNOSIS — G4733 Obstructive sleep apnea (adult) (pediatric): Secondary | ICD-10-CM

## 2018-12-24 NOTE — Assessment & Plan Note (Signed)
With patient's baseline dementia as well as difficulty with managing his chronic health I believe would be in his best interest to see a pulmonary provider they can also treat him in the acute setting if he needs to be in the hospital.  Patient refuses to go to [facility] health acute care settings which makes it difficult for our practice to provide the best care for him.  I believe this would help remove a barrier to the patient's care.  Also explained to the patient that if he does establish with [facility] pulmonary then he needs to try to go to [facility] emergency room's if he feels that he is having an acute issue.  I have contacted [facility] pulmonary to help coordinate patient's care personally.  I have also contacted the patient's daughter Flavia Shipper personally left a message for her to contact our office back so we can further review.

## 2018-12-24 NOTE — Assessment & Plan Note (Signed)
This continues to contribute and be a barrier to the patient's care I have recommended the [facility] if he can get in-home help or release help with management of his medications  

## 2018-12-24 NOTE — Patient Instructions (Addendum)
You were seen today by Lauraine Rinne, NP  for:   1. ILD (interstitial lung disease) ([place])  Please schedule a follow up [facility] - Dr. Early Osmond  Pulmonary Office : [phone]   2. OSA on CPAP  [facility]   3. Chronic respiratory failure with hypoxia (HCC)  Walk today   Continue oxygen therapy as prescribed - 5L pulsed  >>>maintain oxygen saturations greater than 88 percent  >>>if unable to maintain oxygen saturations please contact the office  >>>do not smoke with oxygen  >>>can use nasal saline gel or nasal saline rinses to moisturize nose if oxygen causes dryness   4. Dementia without behavioral disturbance, unspecified dementia type ([place])  [facility] and discuss with them getting in-home help to help you with your medical problems.  Also would be helpful to have somebody who could help coordinate and manage her medications.  5. Care Coordination   As you do not like to present to [facility] for acute care I believe it is best for you to proceed forward with continuing to follow-up with [facility] pulmonary.  I have contacted them for them to call you to get you in for a 2 to 4-week follow-up to see the pulmonologist that you saw back in September/2019 Dr. Early Osmond   Follow Up:    Return if symptoms worsen or fail to improve, for PLEASE FOLLOW UP WITH [facility] PULMONARY, Follow up with Dr. Elsworth Soho.   Please do your part to reduce the spread of COVID-19:      Reduce your risk of any infection  and COVID19 by using the similar precautions used for avoiding the common cold or flu:  Marland Kitchen Wash your hands often with soap and warm water for at least 20 seconds.  If soap and water are not readily available, use an alcohol-based hand sanitizer with at least 60% alcohol.  . If coughing or sneezing, cover your mouth and nose by coughing or sneezing into the elbow areas of your shirt or coat, into a tissue or into your sleeve (not your  hands). Langley Gauss A MASK when in public  . Avoid shaking hands with others and consider head nods or verbal greetings only. . Avoid touching your eyes, nose, or mouth with unwashed hands.  . Avoid close contact with people who are sick. . Avoid places or events with large numbers of people in one location, like concerts or sporting events. . If you have some symptoms but not all symptoms, continue to monitor at home and seek medical attention if your symptoms worsen. . If you are having a medical emergency, call 911.   [place] / e-Visit: [email or web address]         [facility]: [facility]: [phone]                   [facility]: [phone]     It is flu season:   >>> Best ways to protect herself from the flu: Receive the yearly flu vaccine, practice good hand hygiene washing with soap and also using hand sanitizer when available, eat a nutritious meals, get adequate rest, hydrate appropriately   Please contact the office if your symptoms worsen or you have concerns that you are not improving.   Thank you for choosing [facility] for your healthcare, and for allowing [place] to  partner with you on your healthcare journey. I am thankful to be able to provide care to you today.   Wyn Quaker FNP-C

## 2018-12-24 NOTE — Progress Notes (Signed)
  ID: Marijo Sanes, male    DOB: May 29, 1945, 73 y.o.   MRN: [id number]  Chief Complaint  Patient presents with  . Follow-up    C/o sob increased with exertion.Was at [facility] 1 1/2 wks. ago.Wheezing,cough-white,midchest tightness and pain,no fcs    Referring provider: [name], MD  HPI:  73 year old male former smoker followed in our office for interstitial lung disease, chronic respiratory failure and obstructive sleep apnea  PMH: Congestive heart failure, hypertension, dyslipidemia Smoker/ Smoking History: Former smoker.  Quit 1996.  22.5-pack-year smoking history. Maintenance: ? OFEV [name]: Dr. 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  Patient also with moderate to severe emphysema.  Patient presenting today for a follow-up visit.  He has a recent connective tissue work-up in 2019 that was negative for any sort of connective tissue disorders or inflammation.  Patient also has been seen by [facility] pulmonary group per my chart review since last office visit.  It appears that they have started him on Esbriet which she was on briefly but then stopped due to violent dreams and diarrhea.  As of 2019 his last office visit with [facility] health was in September.  Where they started him on Ofev.  Patient was instructed at September/2019 office visit to follow-up with [facility] pulmonary in December/2019.  It does not appear that this happened.  Patient reports he has not followed up with any pulmonary providers this year.  Patient also reports that he never wants to be treated at an acute care facility managed by [facility] health.  He reports that there were difficulties with managing his cardiac symptoms in the past and this is why he feels this way.  He routinely either goes to [facility] system or [facility] health.  Patient  does have baseline dementia.  He still manages his own medications.  He brought all of his medications with him today which does not have both of with it.  Patient is managed on chronic oxygen 4 L pulsed is what he arrived on today.  We will walk him today to further evaluate his oxygen needs.   Patient reports he was recently at a [facility] emergency room with worsening shortness of breath where he was given a prednisone taper.  He also at that time had a productive cough with white sputum.  He reports this is mainly his baseline but he feels like he has been coughing more lately.  He does feel that he is been more short of breath lately.  He is still maintained on prednisone taper.  Patient does have a CPAP at home which is managed by the [place].    Questionaires / Pulmonary Flowsheets:   ACT:  No flowsheet data found.  MMRC: No flowsheet data found.  Epworth:  No flowsheet data found.  Chart review: 12/25/2017- office visit-[facility] critical care-Dr. [name] History from this office note: Patient had started Esbriet and discontinued due to violent dreams.  He also had symptoms of diarrhea. Plan: Patient to start 0FEV #1 interstitial lung disease with previous diagnosis of CT evidence of NSIP. Concerns in regards to the patient's CAT scan demonstrates multiple emphysematous changes and cyst which would be concerning for a possible overlap with L IP. Regardless the patient does have a fibrotic component to his interstitial disease and would recommend ongoing anti-fibrotic  therapy. However this is complicated by the patient's underlying dementia and difficulty tolerating Esbriet in which the patient had developed violent dreams and a feeling of wanting to hurt someone. Patient subsequently discontinued therapy with resolution of symptoms. We will obtain liver panel and ammonia level at this time. Patient will be placed on nintedanib for further management of his  interstitial disease   Tests:   08/15/2017- connective tissue work-up ANA titer-negative Double-stranded DNA AMB-negative Sjogren's syndrome antibodies-negative Anti-Jo 1-negative Hepatic function panel-stable  05/25/2016-CT chest high-res-pulmonary parenchymal pattern of ILD appears stable from prior exams favoring fibrotic NSIP, superimposed on moderate to severe emphysema  08/10/2017-CT chest high-res- progressive interstitial lung disease better with typical UIP CT pattern as above, diffuse bronchial wall thickening with moderate to severe centrilobular and paraseptal emphysema, pattern of mid to upper lung micro nodularity much of which is calcified again suggestive of superimposed silicosis  05/09/2018-echocardiogram- LV ejection fraction 55 to 60%, right ventricle is normal systolic function, cavity was normal, no increase in right ventricle wall thickness  08/15/2016-pulmonary function test- FVC 4.02 (79% predicted), postbronchodilator ratio 81, postbronchodilator FEV1 3.34 (90% predicted), no bronchodilator response, mid flow reversibility, DLCO 9.54 (25% predicted)   FENO:  No results found for: NITRICOXIDE  PFT: PFT Results Latest Ref Rng & Units 08/15/2017 05/20/2016 04/14/2015  FVC-Pre L 4.02 4.42 4.25  FVC-Predicted Pre % 79 86 85  FVC-Post L 4.11 4.41 4.49  FVC-Predicted Post % 81 86 90  Pre FEV1/FVC % % 80 78 84  Post FEV1/FCV % % 81 80 81  FEV1-Pre L 3.23 3.45 3.56  FEV1-Predicted Pre % 87 92 97  FEV1-Post L 3.34 3.53 3.63  DLCO UNC% % 25 32 44  DLCO COR %Predicted % 36 45 60  TLC L 4.17 6.65 -  TLC % Predicted % 53 84 -  RV % Predicted % 15 84 -    WALK:  SIX MIN WALK 07/31/2017 07/06/2016 05/20/2016 10/19/2015 02/10/2015 01/01/2015  Supplimental Oxygen during Test? (L/min) No No - No No No  Tech Comments: Patient only completed 1 lap. O2 dropped down to 84% O2 increased to 88%.  Pt walked at moderate pace with steady gait.  fast steady walk/no complications/TA/CMA no  complaints Pt walked 3 laps at a moderate pace. Pt c/o some mild SOB and cough.  normal pace/increased SOB, cough//lmr    Imaging: No results found.  Lab Results:  CBC    Component Value Date/Time   WBC 8.4 01/04/2011 0420   RBC 3.71 (L) 01/04/2011 0420   HGB 11.5 (L) 01/04/2011 0420   HCT 33.5 (L) 01/04/2011 0420   PLT 184 01/04/2011 0420   MCV 90.3 01/04/2011 0420   MCH 31.0 01/04/2011 0420   MCHC 34.3 01/04/2011 0420   RDW 12.5 01/04/2011 0420   LYMPHSABS 1.3 01/03/2011 0440   MONOABS 1.1 (H) 01/03/2011 0440   EOSABS 0.1 01/03/2011 0440   BASOSABS 0.0 01/03/2011 0440    BMET    Component Value Date/Time   NA 130 (L) 01/04/2011 0420   K 3.9 01/04/2011 0420   CL 94 (L) 01/04/2011 0420   CO2 29 01/04/2011 0420   GLUCOSE 114 (H) 01/04/2011 0420   BUN 11 01/04/2011 0420   CREATININE 0.76 01/04/2011 0420   CALCIUM 9.0 01/04/2011 0420   GFRNONAA >90 01/04/2011 0420   GFRAA >90 01/04/2011 0420    BNP No results found for: BNP  ProBNP    Component Value Date/Time   PROBNP 287.0 (H) 05/29/2007 0335  Specialty Problems      Pulmonary Problems   PULMONARY NODULE, RIGHT MIDDLE LOBE    Qualifier: Diagnosis of  By: Truman Hayward Duncan Dull), Susanne        COPD GOLD 0    Quit smoking 1996  Emphysema on CT , but no obs on PFT       PULMONARY FIBROSIS    First reported on CT 05/2007  - 01/01/2015  Walked RA x 3 laps @ 185 ft each stopped due to sob/ cough nl pace  CT chest 12/2014 >New prominent dense peribronchovascular nodularity in the upper lobes, 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 2. 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 3. 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 - 01/16/2015  Walked RA x 3 laps @ 185 ft each stopped due to sob/ sat 91% nl pace 02/10/2015  Walked RA x 3 laps @ 185 ft each stopped due to End of study, nl pace, no   desat  But sob/coughing     - 02/10/15 NSIP serology c/w lupus> referred to rheumatology        Upper airway cough syndrome    eval 2009 by [name] resp to gerd rx - recurred around 1st September 2016       ILD (interstitial lung disease) (HCC)    Favor fibrotic NSIP vs UIP Pos ANA Lung function and CT stable since 2009      OSA on CPAP    [facility] 11 [facility]      Respiratory failure, chronic (HCC)      Allergies  Allergen Reactions  . Penicillins Anaphylaxis    Immunization History  Administered Date(s) Administered  . 19-influenza Whole 11/23/2018  . Influenza Split 11/27/2014  . Influenza, High Dose Seasonal PF 12/12/2016  . Influenza,inj,Quad PF,6+ Mos 01/27/2016  . Pneumococcal Conjugate-13 12/12/2016    Past Medical History:  Diagnosis Date  . Heart attack (HCC)   . Hyperlipidemia   . OSA (obstructive sleep apnea)    on CPAP     Tobacco History: Social History   Tobacco Use  Smoking Status Former Smoker  . Packs/day: 1.50  . Years: 15.00  . Pack years: 22.50  . Types: Cigarettes  . Quit date: 03/28/1994  . Years since quitting: 24.7  Smokeless Tobacco Never Used   Counseling given: Yes  Continue to not smoke  Outpatient Encounter Medications as of 12/24/2018  Medication Sig  . albuterol (PROVENTIL) (2.5 MG/3ML) 0.083% nebulizer solution Take 2.5 mg by nebulization every 6 (six) hours as needed for wheezing or shortness of breath.  Marland Kitchen albuterol (VENTOLIN HFA) 108 (90 Base) MCG/ACT inhaler Inhale 2 puffs into the lungs every 6 (six) hours as needed for wheezing or shortness of breath.  Marland Kitchen aspirin 81 MG tablet Take 81 mg by mouth daily.  . cetirizine (ZYRTEC) 10 MG  tablet Take 10 mg by mouth daily.  . colestipol (COLESTID) 1 g tablet Take 1 g by mouth 2 (two) times daily. As needed  . FLUoxetine (PROZAC) 20 MG tablet Take 20 mg by mouth daily.  . fluticasone (FLONASE) 50 MCG/ACT nasal spray Place 2 sprays into both nostrils daily.  Marland Kitchen guaifenesin (HUMIBID E) 400 MG TABS tablet Take 400 mg  by mouth every 4 (four) hours.  . memantine (NAMENDA) 10 MG tablet Take 20 mg by mouth at bedtime.  . Multiple Vitamins-Minerals (CENTRUM PO) Take 1 tablet by mouth daily.  . nitroGLYCERIN (NITROSTAT) 0.4 MG SL tablet Place 1 tablet under the tongue as needed.  . Polyvinyl Alcohol-Povidone (REFRESH OP) Apply to eye.  . predniSONE (DELTASONE) 10 MG tablet Take 10 mg by mouth daily with breakfast.  . rivastigmine (EXELON) 3 MG capsule Take 3 mg by mouth 2 (two) times daily.  . traZODone (DESYREL) 100 MG tablet Take 100 mg by mouth at bedtime as needed for sleep.   Marland Kitchen ezetimibe (ZETIA) 10 MG tablet TK 1 T PO D  . hydrochlorothiazide (HYDRODIURIL) 25 MG tablet Take 25 mg by mouth daily.  Marland Kitchen loperamide (IMODIUM) 2 MG capsule Take by mouth.  . simvastatin (ZOCOR) 20 MG tablet Take 20 mg by mouth daily.   No facility-administered encounter medications on file as of 12/24/2018.      Review of Systems  Review of Systems  Constitutional: Positive for fatigue. Negative for activity change, chills, fever and unexpected weight change.  HENT: Negative for postnasal drip, rhinorrhea, sinus pressure, sinus pain and sore throat.   Eyes: Negative.   Respiratory: Positive for cough, shortness of breath and wheezing.   Cardiovascular: Negative for chest pain and palpitations.  Gastrointestinal: Negative for constipation, diarrhea, nausea and vomiting.  Endocrine: Negative.   Genitourinary: Negative.   Musculoskeletal: Negative.   Skin: Negative.   Neurological: Negative for dizziness and headaches.  Psychiatric/Behavioral: Negative.  Negative for dysphoric mood. The patient is not  nervous/anxious.   All other systems reviewed and are negative.    Physical Exam  BP (!) 142/80 (BP Location: Left Arm, Cuff Size: Normal)   Pulse 87   Ht  (1.88 m)   Wt 245 lb (111.1 kg)   SpO2 93%   BMI 31.46 kg/m   Wt Readings from Last 5 Encounters:  12/24/18 245 lb (111.1 kg)  04/26/18 253 lb (114.8 kg)  10/16/17 245 lb 3.2 oz (111.2 kg)  08/15/17 240 lb (108.9 kg)  07/31/17 240 lb (108.9 kg)    BMI Readings from Last 5 Encounters:  12/24/18 31.46 kg/m  04/26/18 33.38 kg/m  10/16/17 31.48 kg/m  08/15/17 30.81 kg/m  07/31/17 30.81 kg/m     Physical Exam Vitals signs and nursing note reviewed.  Constitutional:      General: He is not in acute distress.    Appearance: Normal appearance. He is obese.  HENT:     Head: Normocephalic and atraumatic.     Right Ear: Hearing, tympanic membrane, ear canal and external ear normal.     Left Ear: Hearing, tympanic membrane, ear canal and external ear normal.     Nose: Nose normal. No mucosal edema or rhinorrhea.     Right Turbinates: Not enlarged.     Left Turbinates: Not enlarged.     Mouth/Throat:     Mouth: Mucous membranes are dry.     Pharynx: Oropharynx is clear. No oropharyngeal exudate.  Eyes:     Pupils: Pupils are equal, round, and reactive to light.  Neck:     Musculoskeletal: Normal range of motion.  Cardiovascular:     Rate and Rhythm: Normal rate and regular rhythm.     Pulses: Normal pulses.     Heart sounds: Normal heart sounds. No murmur.  Pulmonary:     Effort: Pulmonary effort is normal. No respiratory distress.  Breath sounds: No decreased breath sounds, wheezing or rales.     Comments: Diminished breath sounds throughout exam Abdominal:     General: Abdomen is flat. Bowel sounds are normal. There is no distension.     Palpations: Abdomen is soft.     Tenderness: There is no abdominal tenderness.  Musculoskeletal:     Right lower leg: Edema (Trace) present.     Left lower leg:  Edema (Trace) present.  Lymphadenopathy:     Cervical: No cervical adenopathy.  Skin:    General: Skin is warm and dry.     Capillary Refill: Capillary refill takes less than 2 seconds.     Findings: No erythema or rash.  Neurological:     General: No focal deficit present.     Mental Status: He is alert and oriented to person, place, and time.     Motor: No weakness.     Coordination: Coordination normal.     Gait: Gait is intact. Gait normal.  Psychiatric:        Mood and Affect: Mood normal.        Speech: Speech normal.        Behavior: Behavior normal. Behavior is cooperative.        Thought Content: Thought content normal.        Cognition and Memory: Cognition is impaired. Memory is impaired.        Judgment: Judgment normal.     Comments: Baseline dementia, brief moments of confusion regarding care and medical history       Assessment & Plan:   ILD (interstitial lung disease) (HCC) Continued confusion regarding medications Looks like patient was trialed on Esbriet per chart review and did not tolerate due to "violent dreams and diarrhea" Patient was then started on Ofev in September/2019 but patient cannot report when he stopped this, patient's medications which he brought office today does not include OFF Patient has not [facility] up with [facility] as instructed Stable clinical exam today, diminished breath sounds on exam likely due to moderate to severe emphysema seen on CT  Plan: I have personally contacted [facility] to get the patient scheduled for a 2 to 4-week follow-up with Dr. Candie EchevariaNamen to get reestablished I have instructed the patient also to call to get scheduled [facility] reported to me that they will start a telephone note to ensure the patient gets followed up with  Since patient does not want to be treated in a [place] acute care facility I believe it would be in his best interest to follow-up  chronically then with [facility] as patient already has baseline dementia [facility] which [facility] multiple barriers to his chronic management of pulmonary problems.  OSA on CPAP Plan: [facility]  Respiratory failure, chronic (HCC) Walk today in office Patient required 5 L pulsed on walk  Plan: Increase oxygen to 5 L pulsed with physical exertion Reminded patient today to take deep breaths in through his nose and out through his mouth Patient to have quick [facility] in the [facility] setting   Dementia This continues to contribute and be a barrier to the patient's care I have recommended the patient speak with his primary care provider at the [facility] to see if he can get in-home help or release help with management of his medications   Coordination of complex care With patient's baseline dementia  as well as difficulty with managing his chronic health I believe would be in his best interest to see a pulmonary provider they can also treat him in the acute setting if he needs to be in the hospital.  Patient refuses to go to [facility] health acute care settings which makes it difficult for our practice to provide the best care for him.  I believe this would help remove a barrier to the patient's care.  Also explained to the patient that if he does establish with [facility] pulmonary then he needs to try to go to [facility] emergency room's if he feels that he is having an acute issue.  I have contacted [facility] to help coordinate patient's care personally.  I have also contacted the patient's daughter Flavia Shipper personally left a message for her to contact our office back so we can further review.  COPD GOLD 0 Moderate to severe emphysema on CT  Plan: Finish prednisone taper as outlined by [facility] Reestablish with [facility] pulmonary over the next 2 to 4 weeks    Return if symptoms  worsen or fail to improve, for PLEASE FOLLOW UP WITH [facility] PULMONARY, Follow up with Dr. Elsworth Soho.   Lauraine Rinne, NP 12/24/2018   This appointment was 45 minutes long with over 50% of the time in direct face-to-face patient care, assessment, plan of care, and follow-up.  This also includes telephone call so [facility] as well as to patient's daughter.

## 2018-12-24 NOTE — Assessment & Plan Note (Signed)
Moderate to severe emphysema on CT  Plan: Finish prednisone taper as outlined by [facility] Reestablish with [facility] pulmonary over the next 2 to 4 weeks

## 2018-12-24 NOTE — Assessment & Plan Note (Signed)
Walk today in office Patient required 5 L pulsed on walk  Plan: Increase oxygen to 5 L pulsed with physical exertion Reminded patient today to take deep breaths in through his nose and out through his mouth Patient to have quick follow-up with [facility] pulmonary in the outpatient setting

## 2018-12-24 NOTE — Assessment & Plan Note (Signed)
Plan: [facility] 

## 2018-12-24 NOTE — Telephone Encounter (Signed)
12/24/2018 0941  I saw patient in the office today.  Patient has been following up at multiple health systems over the last 1 to 2 years.  He also follows up with the [place].  Patient is also established with [facility] pulmonary and last seen in September/2019.  He was supposed to follow-up with them in December/2019.  Patient prefers to follow back up with [facility] health as he does not want to be treated acutely in any of the [facility] health hospitals.  He would prefer to be managed at [facility] acute care hospitals.  With patient's baseline dementia as well as difficulty coordinating care I agree that it would be beneficial for him to have a pulmonologist that can also see him in the acute care setting if needed.  I have contacted [facility] pulmonary.  A telephone note was started to have Dr. Burt Knack nurse contact the patient to get him scheduled in 2 to 4 weeks.  I have also provided the patient the contact information for [facility] pulmonary.  I have also contacted with the patient's permission his daughter Flavia Shipper.  I have left a message for her to contact our office back so that we can review this plan of care with her as well.  It would be beneficial if she was able to coordinate being at the office visit with [facility] pulmonary to help coordinate patient's care.  If patient's daughter contacted our office back please relay this information with her.  Please also provide the same information that was placed on the patient's AVS from the office visit on 12/24/2018.  If she has additional questions are more than happy to contact her at a time when she is available not working.  Will route to triage as an update as they would likely get the message when patient's daughter contacts our office.  Please keep encounter open until patient's daughter is contacted.  Please contact the patient's daughter again on 12/25/2018 as I have already left a message for her  today.  Wyn Quaker, FNP

## 2018-12-24 NOTE — Assessment & Plan Note (Signed)
Continued confusion regarding medications Looks like patient was trialed on Esbriet per chart review and did not tolerate due to "violent dreams and diarrhea" Patient was then started on Ofev in September/2019 but patient cannot report when he stopped this, patient's medications which he brought office today does not include OFF Patient has not followed up with [facility] pulmonary as instructed Stable clinical exam today, diminished breath sounds on exam likely due to moderate to severe emphysema seen on CT  Plan: I have personally contacted [facility] pulmonary to get the patient scheduled for a 2 to 4-week follow-up with Dr. Early Osmond to get reestablished I have instructed the patient also to call to get scheduled [facility] pulmonary reported to me that they will start a telephone note to ensure the patient gets followed up with  Since patient does not want to be treated in a  acute care facility I believe it would be in his best interest to follow-up chronically then with [facility] pulmonary as patient already has baseline dementia and follows up with the [place] which adds multiple barriers to his chronic management of pulmonary problems.

## 2018-12-25 NOTE — Telephone Encounter (Signed)
LMTCB with daughter per BM.

## 2018-12-27 NOTE — Telephone Encounter (Signed)
Thank you   Bruce Winters  

## 2018-12-27 NOTE — Telephone Encounter (Signed)
Called and spoke to pt's daughter, Amy. Informed her of the recs per Aaron Edelman and the need for the pt to have help coordinating his care with [facility]. Also reviewed with Amy the AVS from the pt's last visit with Aaron Edelman. Amy took notes and will follow up to make sure the pt has his f/u with [facility], if not she will call. She will also assure the pt has a f/u with the [place] regarding his CPAP. She verbalized understanding and is aware to contact us back if she has any questions that arise.   Will forward to [place] as FYI.

## 2018-12-27 NOTE — Telephone Encounter (Signed)
Pt's daughter Flavia Shipper calling back.  [phone].

## 2018-12-27 NOTE — Telephone Encounter (Signed)
LMTCB for Amy  

## 2019-01-28 ENCOUNTER — Telehealth: Payer: Self-pay | Admitting: Pulmonary Disease

## 2019-01-28 ENCOUNTER — Ambulatory Visit (INDEPENDENT_AMBULATORY_CARE_PROVIDER_SITE_OTHER): Payer: Medicare HMO | Admitting: Pulmonary Disease

## 2019-01-28 ENCOUNTER — Encounter: Payer: Self-pay | Admitting: Pulmonary Disease

## 2019-01-28 ENCOUNTER — Other Ambulatory Visit: Payer: Self-pay

## 2019-01-28 DIAGNOSIS — G4733 Obstructive sleep apnea (adult) (pediatric): Secondary | ICD-10-CM | POA: Diagnosis not present

## 2019-01-28 DIAGNOSIS — F0390 Unspecified dementia without behavioral disturbance: Secondary | ICD-10-CM

## 2019-01-28 DIAGNOSIS — Z9989 Dependence on other enabling machines and devices: Secondary | ICD-10-CM

## 2019-01-28 DIAGNOSIS — J9611 Chronic respiratory failure with hypoxia: Secondary | ICD-10-CM

## 2019-01-28 DIAGNOSIS — J849 Interstitial pulmonary disease, unspecified: Secondary | ICD-10-CM

## 2019-01-28 DIAGNOSIS — J449 Chronic obstructive pulmonary disease, unspecified: Secondary | ICD-10-CM | POA: Diagnosis not present

## 2019-01-28 DIAGNOSIS — Z7189 Other specified counseling: Secondary | ICD-10-CM

## 2019-01-28 NOTE — Assessment & Plan Note (Signed)
Plan: In September/2020 I personally contacted [facility] pulmonary to get the patient scheduled with Dr. Early Osmond.  Patient has upcoming appointment with them in December/2020 to establish  I would like to evaluate the patient in person in our clinic to ensure he has the appropriate oxygen needs.   We will empirically increase oxygen needs to 5 L today continuous. 3-7d follow-up in our office

## 2019-01-28 NOTE — Assessment & Plan Note (Signed)
We have work to try to help assess the patient's complex needs in the past.  We have coordinated and updated his daughter regarding his care in the past.  I explained to the patient he needs to schedule a follow-up with primary care doctor Sun. I have contacted home health today to give a verbal order for the home health to evaluate the patient again on 01/29/2019 as well as perform a walk Patient needs to keep scheduled appointment with [facility] pulmonary in December/2020

## 2019-01-28 NOTE — Progress Notes (Signed)
Virtual Visit via Telephone Note  I connected with Bruce Winters on 01/28/19 at  4:30 PM EST by telephone and verified that I am speaking with the correct person using two identifiers.  Location: Patient: Home Provider: Office [facility] - [address] [place], [address], [place], [place] [address]   I discussed the limitations, risks, security and privacy concerns of performing an evaluation and management service by telephone and the availability of in person appointments. I also discussed with the patient that there may be a patient responsible charge related to this service. The patient expressed understanding and agreed to proceed.  Patient consented to consult via telephone: Yes People present and their role in pt care: Pt   History of Present Illness: 73 year old male former smoker followed in our office for interstitial lung disease, chronic respiratory failure and obstructive sleep apnea  PMH: Congestive heart failure, hypertension, dyslipidemia Smoker/ Smoking History: Former smoker.  Quit 1996.  22.5-pack-year smoking history. Maintenance: none  [name]: Dr. [name]  Chief complaint: Increased oxygen needs, hypoxic episodes, oxygen levels dropping to 70 to 80% per patient on 4 L  74 year old male former smoker completing a televisit with our office today.  Patient is followed in our office for interstitial lung disease.  At last office visit in September/2020 patient requested to establish with [facility] pulmonary as patient does not want to seek care at [facility] health facilities for acute care.  Patient and previously establish care with [facility] pulmonary Dr. [name].  He has an upcoming appointment to establish with them on 03/04/2019.  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  He is maintained on 4 L continuous at home.  Patient reports that at rest his oxygen levels are above 90%.  He is sitting for the televisit today and his oxygen saturations are 92%.  Unfortunately when he ambulates this  is when the oxygen levels drop to 70 to 80%.  Patient has been evaluated by [facility] home [facility] [phone]) and they reported to the patient to contact pulmonary.  Patient reports that he feels "fine today" he is just concerned about his oxygen levels dropping.  Patient is scattered on the telephone and poor historian regarding health.  He reports he is using his inogen POC when ambulating or traveling.  This is 4 L pulsed.  Patient is established with Dr. Wynelle Link at [facility] for primary care.  Patient cannot tell me when his last appointment with Dr. Wynelle Link was.  Unfortunately since this is [facility] I do not have any records of this.    Chart Review:   12/25/2017- office visit-[facility] critical [facility]-Dr. [name] History from this office note: Patient had started Esbriet and discontinued due to violent dreams.  He also had symptoms of diarrhea. Plan: Patient to start 0FEV #1 interstitial lung disease with previous diagnosis of CT evidence of NSIP. Concerns in regards to the patient's CAT scan demonstrates multiple emphysematous changes and cyst which would be concerning for a possible overlap with L IP. Regardless the patient does have a fibrotic component to his interstitial disease and would recommend ongoing anti-fibrotic therapy. However this is complicated by the patient's underlying dementia and difficulty tolerating Esbriet in which the patient had developed violent dreams and a feeling of wanting to hurt someone. Patient subsequently discontinued therapy with resolution of symptoms. We will obtain liver panel and ammonia level at this time. Patient will be placed on nintedanib for further management of his interstitial disease  03/04/2019 - next appt - with [facility]m - Dr. Early Osmond  Observations/Objective:  01/28/2019 - oxygen - 92 on 4L  01/28/2019 - HR - 104   08/15/2017- connective tissue work-up ANA titer-negative Double-stranded DNA  AMB-negative Sjogren's syndrome antibodies-negative Anti-Jo 1-negative Hepatic function panel-stable  05/25/2016-CT chest high-res-pulmonary parenchymal pattern of ILD appears stable from prior exams favoring fibrotic NSIP, superimposed on moderate to severe emphysema  08/10/2017-CT chest high-res- progressive interstitial lung disease better with typical UIP CT pattern as above, diffuse bronchial wall thickening with moderate to severe centrilobular and paraseptal emphysema, pattern of mid to upper lung micro nodularity much of which is calcified again suggestive of superimposed silicosis  07/04/8117-JYNWGNFAOZHYQM- LV ejection fraction 55 to 60%, right ventricle is normal systolic function, cavity was normal, no increase in right ventricle wall thickness  08/15/2016-pulmonary function test- FVC 4.02 (79% predicted), postbronchodilator ratio 81, postbronchodilator FEV1 3.34 (90% predicted), no bronchodilator response, mid flow reversibility, DLCO 9.54 (25% predicted)  Assessment and Plan:  OSA on CPAP Plan: [facility]  Respiratory failure, chronic ([place]) Walk in office in September/2020 office visit patient required 5 L pulsed on walk Patient has not been adherent to using 5 L pulsed with physical exertion.  This may be the driving factor of his known hypoxia with physical exertion.  Plan: Increase oxygen to 5 L continuous Contacted [facility] home health and gave verbal for as needed follow-up order to have home health evaluate the patient's oxygen levels at home on 01/29/2019 Reminded patient today to take deep breaths in through his nose and out through his mouth We will coordinate bringing the patient into our office later this week to further assess breathing Patient needs to keep upcoming follow-up with [facility] pulmonary in December/2020   ILD (interstitial lung disease) [facility]) Plan: In September/2020 I personally contacted [facility]  pulmonary to get the patient scheduled with Dr. Early Osmond.  Patient has upcoming appointment with them in December/2020 to establish  I would like to evaluate the patient in person in our clinic to ensure he has the appropriate oxygen needs.   We will empirically increase oxygen needs to 5 L today continuous. 3-7d follow-up in our office  Dementia This continues to contribute and be a barrier to the patient's care I have recommended the patient speak with his primary care provider at the [facility] to see if he can get in-home help or release help with management of his medications   Coordination of complex care We have work to try to help assess the patient's complex needs in the past.  We have coordinated and updated his daughter regarding his care in the past.  I explained to the patient he needs to schedule a follow-up with primary care doctor Sun. I have contacted home health today to give a verbal order for the home health to evaluate the patient again on 01/29/2019 as well as perform a walk Patient needs to keep scheduled appointment with [facility] pulmonary in December/2020   Follow Up Instructions:  Return in about 2 days (around 01/30/2019), or if symptoms worsen or fail to improve, for Follow up with Wyn Quaker FNP-C.   I discussed the assessment and treatment plan with the patient. The patient was provided an opportunity to ask questions and all were answered. The patient agreed with the plan and demonstrated an understanding of the instructions.   The patient was advised to call back or seek an in-person evaluation if the symptoms  worsen or if the condition fails to improve as anticipated.  I provided 45 minutes of non-face-to-face time during this encounter.   Coral CeoBrian P Mack, NP

## 2019-01-28 NOTE — Patient Instructions (Addendum)
You were seen today by Lauraine Rinne, NP  for:   1. COPD GOLD 0  2. OSA on CPAP  [facility]  3. Chronic respiratory failure with hypoxia (HCC)  Continue oxygen therapy as prescribed - 5L continuous  >>>maintain oxygen saturations greater than 88 percent  >>>if unable to maintain oxygen saturations please contact the office  >>>do not smoke with oxygen  >>>can use nasal saline gel or nasal saline rinses to moisturize nose if oxygen causes dryness  4. ILD (interstitial lung disease) ([place])  Maintain oxygen saturations greater than 90% Close follow-up in our office Keep scheduled appointment with [facility] pulmonary   We recommend today:  Orders Placed This Encounter  Procedures  . Ambulatory Referral for DME    Referral Priority:   Routine    Referral Type:   Durable Medical Equipment Purchase    Number of Visits Requested:   1   Orders Placed This Encounter  Procedures  . Ambulatory Referral for DME   No orders of the defined types were placed in this encounter.   Follow Up:    Return in about 2 days (around 01/30/2019), or if symptoms worsen or fail to improve, for Follow up with Wyn Quaker FNP-C.   Please do your part to reduce the spread of COVID-19:      Reduce your risk of any infection  and COVID19 by using the similar precautions used for avoiding the common cold or flu:  Marland Kitchen Wash your hands often with soap and warm water for at least 20 seconds.  If soap and water are not readily available, use an alcohol-based hand sanitizer with at least 60% alcohol.  . If coughing or sneezing, cover your mouth and nose by coughing or sneezing into the elbow areas of your shirt or coat, into a tissue or into your sleeve (not your hands). Langley Gauss A MASK when in public  . Avoid shaking hands with others and consider head nods or verbal greetings only. . Avoid touching your eyes, nose, or mouth with unwashed hands.  . Avoid close contact with people who  are sick. . Avoid places or events with large numbers of people in one location, like concerts or sporting events. . If you have some symptoms but not all symptoms, continue to monitor at home and seek medical attention if your symptoms worsen. . If you are having a medical emergency, call 911.   [place] / e-Visit: [email or web address]         [facility]: [facility]: [phone]                   [facility]: [phone]     It is flu season:   >>> Best ways to protect herself from the flu: Receive the yearly flu vaccine, practice good hand hygiene washing with soap and also using hand sanitizer when available, eat a nutritious meals, get adequate rest, hydrate appropriately   Please contact the office if your symptoms worsen or you have concerns that you are not improving.   Thank you for choosing [facility] for your healthcare, and for allowing [place] to partner with you on your healthcare journey. I am thankful to be able to provide care to you today.   Wyn Quaker FNP-C

## 2019-01-28 NOTE — Assessment & Plan Note (Signed)
Plan: Continue CPAP management as outlined by the [facility]

## 2019-01-28 NOTE — Assessment & Plan Note (Signed)
Walk in office in September/2020 office visit patient required 5 L pulsed on walk Patient has not been adherent to using 5 L pulsed with physical exertion.  This may be the driving factor of his known hypoxia with physical exertion.  Plan: Increase oxygen to 5 L continuous Contacted [facility] home health and gave verbal for as needed follow-up order to have home health evaluate the patient's oxygen levels at home on 01/29/2019 Reminded patient today to take deep breaths in through his nose and out through his mouth We will coordinate bringing the patient into our office later this week to further assess breathing Patient needs to keep upcoming follow-up with [facility] pulmonary in December/2020

## 2019-01-28 NOTE — Assessment & Plan Note (Signed)
This continues to contribute and be a barrier to the patient's care I have recommended the patient speak with his primary care provider at the [facility] to see if he can get in-home help or release help with management of his medications

## 2019-01-28 NOTE — Telephone Encounter (Signed)
Called spoke with patient Prod cough with cream mucus, incrteased SHOB, wheezing x1 month; sats drop into 80-s, sometimes 70s with exertion on 4L continuous  Denies any hemoptysis, f/c/s, sick contacts  Last seen 9.28.2020 by Warner Mccreedy NP Televisit scheduled for now @ 1630  Nothing further needed; will sign off.

## 2019-01-29 ENCOUNTER — Telehealth: Payer: Self-pay | Admitting: Pulmonary Disease

## 2019-01-29 DIAGNOSIS — J9611 Chronic respiratory failure with hypoxia: Secondary | ICD-10-CM

## 2019-01-29 DIAGNOSIS — J849 Interstitial pulmonary disease, unspecified: Secondary | ICD-10-CM

## 2019-01-29 DIAGNOSIS — J449 Chronic obstructive pulmonary disease, unspecified: Secondary | ICD-10-CM

## 2019-01-29 NOTE — Telephone Encounter (Signed)
Patient has an appointment tomorrow with Aaron Edelman.  I tried to call [employer] but it's after 5 and the after hours answering service took a message.  It appears that we just need to talk to to patient about switching to a larger concentrator to better accommodate his O2 needs, and order this if pt is agreeable.  Forwarding to [employer] as FYI.

## 2019-01-29 NOTE — Telephone Encounter (Signed)
We can further evaluate this in office tomorrow when patient is physically walked and physically examined.  Patient was supposed to have a home health evaluation today to further evaluate physical exertion and hypoxia but have not received any records from [place] home health regarding this.We will further evaluate in office tomorrow.  We are also actively working to get the patient reestablished with [facility] pulmonary.Wyn Quaker, FNP

## 2019-01-29 NOTE — Telephone Encounter (Signed)
Catron, [place], Valentina Lucks, Allyne Gee, Omar; Elon Alas        i called but was on hold for about 9 minutes. Please let the Dr know the below.   Previous Messages  ----- Message -----  From: Mariann Barter  Sent: 01/29/2019  4:00 PM EST  To: Darlina Guys, Elon Alas, *  Subject: RE: oxygen                    Patient advised --Pt adv his MD told him to just increase existing 5t L conc to 5 liter setting for exertion. He said he followed MD instructions and he's doing fine w/out getting a new 10 L conc. Processor advised him the new concentrator would be est $27.35 / mo. He has another appt next week and will call us back if he feels a new concentrator is needed, or have MD stress to patient that his 5 L conc should be switched out to a 10 L conc for the new Rx of 5 LPM on exertion. He needs the 10 liter concentrator because anything over 4lpm needs to have a 1o liter concentrator. I will call and leave a staff message as well.

## 2019-01-29 NOTE — Telephone Encounter (Signed)
Catron, Bruce Lucks, Allyne Gee, [place]; [place], Jeanie Cooks, Anderson [place] Winters        5:17 no staff message left-- We need the order corrected to state how he is to use the 5lpm - cont, on exertion, etc

## 2019-01-29 NOTE — Telephone Encounter (Signed)
New order placed. PCCs aware. Nothing further needed at this time.

## 2019-01-29 NOTE — Progress Notes (Signed)
  ID: Bruce Winters, male    DOB: 1945/11/06, 73 y.o.   MRN: [id number]  Chief Complaint  Patient presents with   Follow-up    Reports that he is not improving since his last OV with [name] on 01/28/2019.    Referring provider: [name], MD  HPI:  73 year old male former smoker followed in our office for interstitial lung disease, chronic respiratory failure and obstructive sleep apnea  PMH: Congestive heart failure, hypertension, dyslipidemia Smoker/ Smoking History: Former smoker.  Quit 1996.  22.5-pack-year smoking history. Maintenance: none  [name]: Dr. [name]  01/30/2019  - Visit   73 year old male former smoker presenting to our office today as a short-term follow-up after a televisit earlier this week.  Patient contacted our office reporting that he has had increased hypoxia at home.  We are unable to fully assess as this was a telephonic visit.  We attempted to coordinate a home health evaluation with [facility] his home health company we have not heard back regarding the results of that walk.  Patient presenting today for further evaluation.  At last office visit in September/2020 patient's oxygen needs were 4 L continuous at rest and he could go up to 5 L pulsed with physical exertion.  Walk today in office requires 8 L continuous with physical exertion.  This is a significant change.  Patient endorses no other significant complaints or concerns.  He is just noticed that his oxygen levels have dropped.  He denies chest pain, leg swelling, cough, congestion.  It is difficult to also fully ascertain patient's symptoms as he does have known dementia and he is here alone with his office visit today.  Our office was able to coordinate a follow-up visit with [facility] pulmonary Dr. [name] on 03/04/2019.  Patient will establish there is a pulmonary patient.  Patient refuses to seek emergent care at [facility] health acute care facilities.  This makes it difficult  for [place] to fully manage him.  That is why patient has chosen to establish care with [facility] pulmonary.   Patient also has a CPAP which is managed by the [place].  Chart Review:   12/25/2017- office visit-[facility] critical care-Dr. [name] History from this office note: Patient had started Esbriet and discontinued due to violent dreams.  He also had symptoms of diarrhea. Plan: Patient to start 0FEV #1 interstitial lung disease with previous diagnosis of CT evidence of NSIP. Concerns in regards to the patient's CAT scan demonstrates multiple emphysematous changes and cyst which would be concerning for a possible overlap with L IP. Regardless the patient does have a fibrotic component to his interstitial disease and would recommend ongoing anti-fibrotic therapy. However this is complicated by the patient's underlying dementia and difficulty tolerating Esbriet in which the patient had developed violent dreams and a feeling of wanting to hurt someone. Patient subsequently discontinued therapy with resolution of symptoms. We will obtain liver panel and ammonia level at this time. Patient will be placed on nintedanib for further management of his interstitial disease  03/04/2019 - next appt - with [facility] pulm - Dr. [name]  Tests:   08/15/2017- connective tissue work-up ANA titer-negative Double-stranded DNA AMB-negative Sjogren's syndrome antibodies-negative Anti-Jo 1-negative Hepatic function panel-stable  05/25/2016-CT chest high-res-pulmonary parenchymal pattern of ILD appears stable from prior exams favoring fibrotic NSIP, superimposed on moderate to severe emphysema  08/10/2017-CT chest high-res- progressive interstitial lung disease better with typical UIP CT pattern as above, diffuse  bronchial wall thickening with moderate to severe centrilobular and paraseptal emphysema, pattern of mid to upper lung micro nodularity much of which is calcified again suggestive of  superimposed silicosis  05/09/2018-echocardiogram- LV ejection fraction 55 to 60%, right ventricle is normal systolic function, cavity was normal, no increase in right ventricle wall thickness  08/15/2016-pulmonary function test- FVC 4.02 (79% predicted), postbronchodilator ratio 81, postbronchodilator FEV1 3.34 (90% predicted), no bronchodilator response, mid flow reversibility, DLCO 9.54 (25% predicted)  FENO:  No results found for: NITRICOXIDE  PFT: PFT Results Latest Ref Rng & Units 08/15/2017 05/20/2016 04/14/2015  FVC-Pre L 4.02 4.42 4.25  FVC-Predicted Pre % 79 86 85  FVC-Post L 4.11 4.41 4.49  FVC-Predicted Post % 81 86 90  Pre FEV1/FVC % % 80 78 84  Post FEV1/FCV % % 81 80 81  FEV1-Pre L 3.23 3.45 3.56  FEV1-Predicted Pre % 87 92 97  FEV1-Post L 3.34 3.53 3.63  DLCO UNC% % 25 32 44  DLCO COR %Predicted % 36 45 60  TLC L 4.17 6.65 -  TLC % Predicted % 53 84 -  RV % Predicted % 15 84 -    WALK:  SIX MIN WALK 01/30/2019 12/24/2018 07/31/2017 07/06/2016 05/20/2016 10/19/2015 02/10/2015  Supplimental Oxygen during Test? (L/min) Yes Yes No No - No No  O2 Flow Rate 8 4 - - - - -  Type Continuous Pulse - - - - -  Tech Comments: Patient was very short of breath, needing to slow pace, catch breath and adjust oxygen during walk. Patient wanted to stay on pulsed but needs 8 L of continuous flow.//btr Walked 1 lap, had to stop and rest 1 time before returning to the room. Patient only completed 1 lap. O2 dropped down to 84% O2 increased to 88%.  Pt walked at moderate pace with steady gait.  fast steady walk/no complications/TA/CMA no complaints Pt walked 3 laps at a moderate pace. Pt c/o some mild SOB and cough.     Imaging: No results found.  Lab Results:  CBC    Component Value Date/Time   WBC 8.4 01/04/2011 0420   RBC 3.71 (L) 01/04/2011 0420   HGB 11.5 (L) 01/04/2011 0420   HCT 33.5 (L) 01/04/2011 0420   PLT 184 01/04/2011 0420   MCV 90.3 01/04/2011 0420   MCH 31.0 01/04/2011  0420   MCHC 34.3 01/04/2011 0420   RDW 12.5 01/04/2011 0420   LYMPHSABS 1.3 01/03/2011 0440   MONOABS 1.1 (H) 01/03/2011 0440   EOSABS 0.1 01/03/2011 0440   BASOSABS 0.0 01/03/2011 0440    BMET    Component Value Date/Time   NA 130 (L) 01/04/2011 0420   K 3.9 01/04/2011 0420   CL 94 (L) 01/04/2011 0420   CO2 29 01/04/2011 0420   GLUCOSE 114 (H) 01/04/2011 0420   BUN 11 01/04/2011 0420   CREATININE 0.76 01/04/2011 0420   CALCIUM 9.0 01/04/2011 0420   GFRNONAA >90 01/04/2011 0420   GFRAA >90 01/04/2011 0420    BNP No results found for: BNP  ProBNP    Component Value Date/Time   PROBNP 287.0 (H) 05/29/2007 0335    Specialty Problems      Pulmonary Problems   PULMONARY NODULE, RIGHT MIDDLE LOBE    Qualifier: Diagnosis of  By: Truman Hayward Duncan Dull), Susanne        COPD GOLD 0    Quit smoking 1996  Emphysema on CT , but no obs on PFT  PULMONARY FIBROSIS    First reported on CT 05/2007  - 01/01/2015  Walked RA x 3 laps @ 185 ft each stopped due to sob/ cough nl pace  CT chest 12/2014 >New prominent dense peribronchovascular nodularity in the upper lobes, 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 2. 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 3. 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 - 01/16/2015  Walked RA x 3 laps @ 185 ft each stopped due to sob/ sat 91% nl pace 02/10/2015  Walked RA x 3 laps @ 185 ft each stopped due to End of study, nl pace, no   desat  But sob/coughing     - 02/10/15 NSIP serology c/w lupus> referred to rheumatology        Upper airway cough  syndrome    eval 2009 by [name] resp to gerd rx - recurred around 1st September 2016       ILD (interstitial lung disease) (HCC)    Favor fibrotic NSIP vs UIP Pos ANA Lung function and CT stable since 2009      OSA on CPAP    [facility] 11 [facility]      Respiratory failure, chronic (HCC)   Shortness of breath      Allergies  Allergen Reactions   Penicillins Anaphylaxis    Immunization History  Administered Date(s) Administered   19-influenza Whole 11/23/2018   Influenza Split 11/27/2014   Influenza, High Dose Seasonal PF 12/12/2016, 11/28/2018   Influenza, Seasonal, Injecte, Preservative Fre 01/27/2016   Influenza,inj,Quad PF,6+ Mos 01/27/2016   Pneumococcal Conjugate-13 12/12/2016   Zoster Recombinat (Shingrix) 07/11/2017, 09/13/2017    Past Medical History:  Diagnosis Date   Heart attack (HCC)    Hyperlipidemia    OSA (obstructive sleep apnea)    on CPAP     Tobacco History: Social History   Tobacco Use  Smoking Status Former Smoker   Packs/day: 1.50   Years: 15.00   Pack years: 22.50   Types: Cigarettes   Quit date: 03/28/1994   Years since quitting: 24.8  Smokeless Tobacco Never Used   Counseling given: Yes   Continue to not smoke  Outpatient Encounter Medications as of 01/30/2019  Medication Sig   albuterol (PROVENTIL) (2.5 MG/3ML) 0.083% nebulizer solution Take 2.5 mg by nebulization every 6 (six) hours as needed for wheezing or shortness of breath.   albuterol (VENTOLIN HFA) 108 (90 Base) MCG/ACT inhaler Inhale 2 puffs into the lungs every 6 (six) hours as needed for wheezing or shortness of breath.   aspirin 81 MG tablet Take 81 mg by mouth daily.   colestipol (COLESTID) 1 g tablet Take 1 g by mouth 2 (two) times daily. As needed   FLUoxetine (PROZAC) 20 MG tablet Take 20 mg by mouth daily.   fluticasone (FLONASE) 50 MCG/ACT nasal spray Place 2 sprays into both nostrils daily.   memantine (NAMENDA) 10 MG tablet Take  20 mg by mouth at bedtime.   Multiple Vitamins-Minerals (CENTRUM PO) Take 1 tablet by mouth daily.   rivastigmine (EXELON) 3 MG capsule Take 3 mg by mouth 2 (two) times daily.   traZODone (DESYREL) 100 MG tablet Take 100 mg by mouth at bedtime as needed for sleep.    [  DISCONTINUED] cetirizine (ZYRTEC) 10 MG tablet Take 10 mg by mouth daily.   [DISCONTINUED] ezetimibe (ZETIA) 10 MG tablet TK 1 T PO D   [DISCONTINUED] guaifenesin (HUMIBID E) 400 MG TABS tablet Take 400 mg by mouth every 4 (four) hours.   [DISCONTINUED] hydrochlorothiazide (HYDRODIURIL) 25 MG tablet Take 25 mg by mouth daily.   [DISCONTINUED] loperamide (IMODIUM) 2 MG capsule Take by mouth.   [DISCONTINUED] nitroGLYCERIN (NITROSTAT) 0.4 MG SL tablet Place 1 tablet under the tongue as needed.   [DISCONTINUED] Polyvinyl Alcohol-Povidone (REFRESH OP) Apply to eye.   [DISCONTINUED] predniSONE (DELTASONE) 10 MG tablet Take 10 mg by mouth daily with breakfast.   [DISCONTINUED] simvastatin (ZOCOR) 20 MG tablet Take 20 mg by mouth daily.   No facility-administered encounter medications on file as of 01/30/2019.      Review of Systems  Review of Systems  Constitutional: Positive for fatigue. Negative for activity change, diaphoresis and fever.  HENT: Negative for sneezing, sore throat and trouble swallowing.   Eyes: Negative.   Respiratory: Positive for cough, shortness of breath and wheezing.   Cardiovascular: Negative for chest pain, palpitations and leg swelling.  Gastrointestinal: Negative for diarrhea, nausea and vomiting.  Endocrine: Negative.   Genitourinary: Negative.   Musculoskeletal: Negative.  Negative for arthralgias.  Skin: Negative.   Allergic/Immunologic: Negative.   Neurological: Negative.   Hematological: Negative.   Psychiatric/Behavioral: Negative.  Negative for dysphoric mood. The patient is not nervous/anxious.      Physical Exam  BP 130/78 (BP Location: Left Arm, Cuff Size: Normal)     Pulse 96    Ht 6\' 2"  (1.88 m)    Wt 249 lb (112.9 kg)    SpO2 90%    BMI 31.97 kg/m   Wt Readings from Last 5 Encounters:  01/30/19 249 lb (112.9 kg)  12/24/18 245 lb (111.1 kg)  04/26/18 253 lb (114.8 kg)  10/16/17 245 lb 3.2 oz (111.2 kg)  08/15/17 240 lb (108.9 kg)    BMI Readings from Last 5 Encounters:  01/30/19 31.97 kg/m  12/24/18 31.46 kg/m  04/26/18 33.38 kg/m  10/16/17 31.48 kg/m  08/15/17 30.81 kg/m    Physical Exam Vitals signs and nursing note reviewed.  Constitutional:      General: He is not in acute distress.    Appearance: Normal appearance. He is obese.     Comments: Chronically ill elderly male  HENT:     Head: Normocephalic and atraumatic.     Right Ear: Hearing, tympanic membrane, ear canal and external ear normal.     Left Ear: Hearing, tympanic membrane, ear canal and external ear normal.     Nose: Nose normal. No mucosal edema, congestion or rhinorrhea.     Right Turbinates: Not enlarged.     Left Turbinates: Not enlarged.     Mouth/Throat:     Mouth: Mucous membranes are dry.     Pharynx: Oropharynx is clear. No oropharyngeal exudate.  Eyes:     Pupils: Pupils are equal, round, and reactive to light.  Neck:     Musculoskeletal: Normal range of motion.  Cardiovascular:     Rate and Rhythm: Normal rate and regular rhythm.     Pulses: Normal pulses.     Heart sounds: Normal heart sounds. No murmur.  Pulmonary:     Effort: Pulmonary effort is normal.     Breath sounds: Decreased breath sounds present. No wheezing or rales.     Comments: Diminished breath sounds.exam Abdominal:  General: Bowel sounds are normal. There is no distension.     Palpations: Abdomen is soft.     Tenderness: There is no abdominal tenderness.  Musculoskeletal:     Right lower leg: 1+ Pitting Edema present.     Left lower leg: 1+ Pitting Edema present.  Lymphadenopathy:     Cervical: No cervical adenopathy.  Skin:    General: Skin is warm and dry.      Capillary Refill: Capillary refill takes less than 2 seconds.     Findings: No erythema or rash.  Neurological:     General: No focal deficit present.     Mental Status: He is alert and oriented to person, place, and time.     Motor: No weakness.     Coordination: Coordination normal.     Gait: Gait is intact. Gait normal.  Psychiatric:        Attention and Perception: He is inattentive.        Mood and Affect: Mood normal. Affect is flat.        Speech: Speech is delayed.        Behavior: Behavior normal. Behavior is cooperative.        Thought Content: Thought content normal.        Cognition and Memory: Cognition is impaired. Memory is impaired. He exhibits impaired recent memory and impaired remote memory.        Judgment: Judgment normal.     Comments: Poor historian Patient frequently confused regarding follow-up with other providers Baseline dementia Patient is alone for office visit today        Assessment & Plan:   COPD GOLD 0 Plan: Chest x-ray today Lab work today Increased oxygen needs on walk today May need to consider prednisone taper after lab work and chest x-ray results have come back Continue rescue inhaler and albuterol nebulized meds as needed  ILD (interstitial lung disease) ([place]) Plan: In September/2020 I personally contacted [facility] pulmonary to get the patient scheduled with Dr. Early Winters.  Patient has upcoming appointment with them in December/2020 to establish  I would like to evaluate the patient in person in our clinic to ensure he has the appropriate oxygen needs.    Walk today shows patient needs 8 L continuous with physical exertion  We will place an order for a new concentrator for the capacity of 10 L Patient be discharged from our office today with a tank to ensure he is able to maintain oxygen saturations appropriately Patient still to establish care with [facility] pulmonary   OSA on CPAP Plan: [facility]  Respiratory failure, chronic ([place]) Plan: Walk today in office required 8 L continuous with physical exertion Patient to be maintained on 8 L continuous Lab work today Chest x-ray today Patient to have an order with his DME for a concentrator with a capacity of 10 L Patient be discharged from our practice today with a adapt tank as he cannot use his POC  Dementia Plan: This continues to be a struggle for the patient with baseline dementia.  He struggles with his management as well as the management of coordinating his care.  We have attempted in the past to contact his daughter to update her.  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 [name], Follow up with Elisha Headland FNP-C.   Coral Ceo, NP 01/30/2019   This appointment was 45 minutes long with over 50% of the time in direct face-to-face patient care, assessment, plan of care, and follow-up.

## 2019-01-30 ENCOUNTER — Other Ambulatory Visit: Payer: Self-pay

## 2019-01-30 ENCOUNTER — Encounter: Payer: Self-pay | Admitting: Pulmonary Disease

## 2019-01-30 ENCOUNTER — Ambulatory Visit: Payer: Medicare HMO | Admitting: Pulmonary Disease

## 2019-01-30 ENCOUNTER — Ambulatory Visit (INDEPENDENT_AMBULATORY_CARE_PROVIDER_SITE_OTHER): Payer: Medicare HMO

## 2019-01-30 VITALS — BP 130/78 | HR 96 | Ht 74.0 in | Wt 249.0 lb

## 2019-01-30 DIAGNOSIS — J849 Interstitial pulmonary disease, unspecified: Secondary | ICD-10-CM | POA: Diagnosis not present

## 2019-01-30 DIAGNOSIS — J449 Chronic obstructive pulmonary disease, unspecified: Secondary | ICD-10-CM

## 2019-01-30 DIAGNOSIS — J9611 Chronic respiratory failure with hypoxia: Secondary | ICD-10-CM | POA: Diagnosis not present

## 2019-01-30 DIAGNOSIS — G4733 Obstructive sleep apnea (adult) (pediatric): Secondary | ICD-10-CM

## 2019-01-30 DIAGNOSIS — R0602 Shortness of breath: Secondary | ICD-10-CM

## 2019-01-30 DIAGNOSIS — Z9989 Dependence on other enabling machines and devices: Secondary | ICD-10-CM

## 2019-01-30 DIAGNOSIS — Z7189 Other specified counseling: Secondary | ICD-10-CM

## 2019-01-30 DIAGNOSIS — F0390 Unspecified dementia without behavioral disturbance: Secondary | ICD-10-CM

## 2019-01-30 LAB — CBC WITH DIFFERENTIAL/PLATELET
Basophils Absolute: 0.1 10*3/uL (ref 0.0–0.1)
Basophils Relative: 0.6 % (ref 0.0–3.0)
Eosinophils Absolute: 0.1 10*3/uL (ref 0.0–0.7)
Eosinophils Relative: 1.3 % (ref 0.0–5.0)
HCT: 40.1 % (ref 39.0–52.0)
Hemoglobin: 13.5 g/dL (ref 13.0–17.0)
Lymphocytes Relative: 13.8 % (ref 12.0–46.0)
Lymphs Abs: 1.3 10*3/uL (ref 0.7–4.0)
MCHC: 33.6 g/dL (ref 30.0–36.0)
MCV: 90.8 fl (ref 78.0–100.0)
Monocytes Absolute: 0.7 10*3/uL (ref 0.1–1.0)
Monocytes Relative: 7.9 % (ref 3.0–12.0)
Neutro Abs: 6.9 10*3/uL (ref 1.4–7.7)
Neutrophils Relative %: 76.4 % (ref 43.0–77.0)
Platelets: 252 10*3/uL (ref 150.0–400.0)
RBC: 4.42 Mil/uL (ref 4.22–5.81)
RDW: 14.2 % (ref 11.5–15.5)
WBC: 9.1 10*3/uL (ref 4.0–10.5)

## 2019-01-30 LAB — COMPREHENSIVE METABOLIC PANEL
ALT: 13 U/L (ref 0–53)
AST: 16 U/L (ref 0–37)
Albumin: 4 g/dL (ref 3.5–5.2)
Alkaline Phosphatase: 102 U/L (ref 39–117)
BUN: 14 mg/dL (ref 6–23)
CO2: 31 mEq/L (ref 19–32)
Calcium: 9.5 mg/dL (ref 8.4–10.5)
Chloride: 97 mEq/L (ref 96–112)
Creatinine, Ser: 1 mg/dL (ref 0.40–1.50)
GFR: 73.14 mL/min (ref >60.00)
Glucose, Bld: 95 mg/dL (ref 70–99)
Potassium: 4.3 mEq/L (ref 3.5–5.1)
Sodium: 135 mEq/L (ref 135–145)
Total Bilirubin: 0.4 mg/dL (ref 0.2–1.2)
Total Protein: 7.3 g/dL (ref 6.0–8.3)

## 2019-01-30 LAB — BRAIN NATRIURETIC PEPTIDE: Pro B Natriuretic peptide (BNP): 61 pg/mL (ref 0.0–100.0)

## 2019-01-30 LAB — D-DIMER, QUANTITATIVE: D-Dimer, Quant: 0.49 mcg/mL FEU (ref <0.50)

## 2019-01-30 NOTE — Assessment & Plan Note (Signed)
Plan: In September/2020 I personally contacted [facility] pulmonary to get the patient scheduled with Dr. Early Osmond.  Patient has upcoming appointment with them in December/2020 to establish  I would like to evaluate the patient in person in our clinic to ensure he has the appropriate oxygen needs.    Walk today shows patient needs 8 L continuous with physical exertion  We will place an order for a new concentrator for the capacity of 10 L Patient be discharged from our office today with a tank to ensure he is able to maintain oxygen saturations appropriately Patient still to establish care with [facility] pulmonary

## 2019-01-30 NOTE — Telephone Encounter (Signed)
Patient was seen in the office today 01/30/19 with Bruce Edelman NP Called [employer] and spoke with Levada Dy; nothing further is needed at this time.  Will sign off.

## 2019-01-30 NOTE — Assessment & Plan Note (Signed)
Likely worsened NSIP Doubt patient is having a DVT or PE based off exam as well as vitals  Plan: Chest x-ray today Lab work today Increased oxygen needs to 8 L continuous with physical exertion Discharge today with tank in order to maintain oxygen saturations Needs new DME order for concentrator with 10 L capacity

## 2019-01-30 NOTE — Assessment & Plan Note (Signed)
Plan: Reviewed patient's upcoming appointments with him Provided contact information for [facility] pulmonary Emphasized the importance that he keep his 03/04/2019 office visit  We will follow up with his DME company to ensure he has the oxygen supplies and equipment that he needs

## 2019-01-30 NOTE — Assessment & Plan Note (Signed)
Plan: This continues to be a struggle for the patient with baseline dementia.  He struggles with his management as well as the management of coordinating his care.  We have attempted in the past to contact his daughter to update her.  I reviewed again with the patient his upcoming follow-up with [facility] gastroenterology as well as his upcoming follow-up with [facility] pulmonary

## 2019-01-30 NOTE — Assessment & Plan Note (Signed)
Plan: [facility] managed by the [facility]

## 2019-01-30 NOTE — Assessment & Plan Note (Signed)
Plan: Walk today in office required 8 L continuous with physical exertion Patient to be maintained on 8 L continuous Lab work today Chest x-ray today Patient to have an order with his DME for a concentrator with a capacity of 10 L Patient be discharged from our practice today with a adapt tank as he cannot use his POC

## 2019-01-30 NOTE — Progress Notes (Signed)
Slightly progressed pulmonary fibrosis on chest x-ray today.  No pneumonia or effusion or pneumothorax.  This is good news.  Lab work from today is also stable.  Wyn Quaker, FNP

## 2019-01-30 NOTE — Progress Notes (Signed)
Lab work stable today.  This is good news.  Please route these results to patient's primary care provider.  Patient needs to keep upcoming follow-up visit with [facility] pulmonary on 03/04/2019.  Wyn Quaker, FNP

## 2019-01-30 NOTE — Patient Instructions (Addendum)
You were seen today by Lauraine Rinne, NP  for:   1. Shortness of breath  - DG Chest 2 View; Future - CBC with Differential/Platelet; Future - Comp Met (CMET); Future - B Nat Peptide; Future - D-Dimer, Quantitative; Future  2. Chronic respiratory failure with hypoxia (HCC)  - DG Chest 2 View; Future - B Nat Peptide; Future - D-Dimer, Quantitative; Future  Need new order for concentrator at home to have 10 L capacity  Continue oxygen therapy as prescribed - 8L continuous >>>maintain oxygen saturations greater than 88 percent  >>>if unable to maintain oxygen saturations please contact the office  >>>do not smoke with oxygen  >>>can use nasal saline gel or nasal saline rinses to moisturize nose if oxygen causes dryness   3. COPD GOLD 0   4. ILD (interstitial lung disease) [facility])  Continue follow-up with [facility] pulmonary on 03/04/2019 at 10 AM with Dr. Laverle Hobby, Satira Mccall[address], [address] [place], [place] [address]  [phone]  [phone] (Fax)   5. OSA on CPAP  [facility]  6. Dementia without behavioral disturbance, unspecified dementia type ([place])  I continue to recommend that you should have somebody with you for your appointments.  Please make sure that you are following up with Dr. Donzetta Kohut on 03/04/2019  7. Coordination of complex care    We recommend today:  Orders Placed This Encounter  Procedures  . DG Chest 2 View    Standing Status:   Future    Standing Expiration Date:   03/31/2020    Order Specific Question:   Reason for Exam (SYMPTOM  OR DIAGNOSIS REQUIRED)    Answer:   short of breath    Order Specific Question:   Preferred imaging location?    Answer:   Internal    Order Specific Question:   Radiology Contrast Protocol - do NOT remove file path    Answer:   [email or web address]  . CBC with Differential/Platelet    Standing Status:   Future    Standing  Expiration Date:   01/30/2020  . Comp Met (CMET)    Standing Status:   Future    Standing Expiration Date:   01/30/2020  . B Nat Peptide    Standing Status:   Future    Standing Expiration Date:   01/30/2020  . D-Dimer, Quantitative    Standing Status:   Future    Standing Expiration Date:   01/30/2020   Orders Placed This Encounter  Procedures  . DG Chest 2 View  . CBC with Differential/Platelet  . Comp Met (CMET)  . B Nat Peptide  . D-Dimer, Quantitative   No orders of the defined types were placed in this encounter.   Follow Up:    Return if symptoms worsen or fail to improve, for Follow up with Dr. Elsworth Soho, Follow up with Wyn Quaker FNP-C.  Please establish with [facility] on 03/04/2019 at 10am  Please do your part to reduce the spread of COVID-19:      Reduce your risk of any infection  and COVID19 by using the similar precautions used for avoiding the common cold or flu:  Marland Kitchen Wash your hands often with soap and warm water for at least 20 seconds.  If soap and water are not readily available, use an alcohol-based hand sanitizer with at least 60% alcohol.  . If coughing or sneezing, cover your mouth and nose by coughing or sneezing into  the elbow areas of your shirt or coat, into a tissue or into your sleeve (not your hands). Langley Gauss A MASK when in public  . Avoid shaking hands with others and consider head nods or verbal greetings only. . Avoid touching your eyes, nose, or mouth with unwashed hands.  . Avoid close contact with people who are sick. . Avoid places or events with large numbers of people in one location, like concerts or sporting events. . If you have some symptoms but not all symptoms, continue to monitor at home and seek medical attention if your symptoms worsen. . If you are having a medical emergency, call 911.   [place] / e-Visit: [email or web address]          [facility]: [facility]: [phone]                   [facility]: [phone]     It is flu season:   >>> Best ways to protect herself from the flu: Receive the yearly flu vaccine, practice good hand hygiene washing with soap and also using hand sanitizer when available, eat a nutritious meals, get adequate rest, hydrate appropriately   Please contact the office if your symptoms worsen or you have concerns that you are not improving.   Thank you for choosing [facility] for your healthcare, and for allowing [place] to partner with you on your healthcare journey. I am thankful to be able to provide care to you today.   Wyn Quaker FNP-C

## 2019-01-30 NOTE — Assessment & Plan Note (Addendum)
Plan: Chest x-ray today Lab work today Increased oxygen needs on walk today May need to consider prednisone taper after lab work and chest x-ray results have come back Continue rescue inhaler and albuterol nebulized meds as needed

## 2019-01-31 ENCOUNTER — Ambulatory Visit: Payer: Medicare HMO | Admitting: Cardiology

## 2019-01-31 ENCOUNTER — Telehealth: Payer: Self-pay | Admitting: Pulmonary Disease

## 2019-01-31 DIAGNOSIS — J9611 Chronic respiratory failure with hypoxia: Secondary | ICD-10-CM

## 2019-01-31 NOTE — Progress Notes (Signed)
See prev note.  B

## 2019-01-31 NOTE — Telephone Encounter (Signed)
Call returned to patient, confirmed DOB, he states he would like larger oxygen tanks. He states he is now using 8L and needs bigger oxygen tanks. He is requesting more tanks as well as bigger tanks or tanks that last longer.   RA please advise. Can we send a order for [facility] to provider pt with bigger tanks that last longer. Thanks.

## 2019-02-01 NOTE — Telephone Encounter (Signed)
OK to send order

## 2019-02-01 NOTE — Telephone Encounter (Signed)
Spoke with Chelsea at [employer]. She stated that they have not received the order for the oxygen and the patient is at [facility] now. Per [place], the order needs to be faxed to intake at [phone] and marked as urgent.

## 2019-02-01 NOTE — Telephone Encounter (Signed)
Left message for patient to call back. Wanted to make him aware that the order has been placed.

## 2019-02-01 NOTE — Telephone Encounter (Signed)
Sorry for the confusion.  I thought this was addressed when the patient was in our office earlier this week.  Yes absolutely okay to send the order.  Wyn Quaker FNP

## 2019-02-01 NOTE — Telephone Encounter (Signed)
I spoke to [facility] they have this order it was sent on 01/30/19 she is calling referral intake and have them pull the new order

## 2019-02-04 NOTE — Progress Notes (Signed)
See prev notes.  Bruce Winters

## 2019-02-19 ENCOUNTER — Telehealth: Payer: Self-pay | Admitting: Pulmonary Disease

## 2019-02-19 NOTE — Telephone Encounter (Signed)
Called and spoke to pt. Pt states he spoke with [facility] and they informed pt that they need a new rx for O2 to be at 8lpm with exertion. There is a referral in pts chart showing an order placed with [facility] earlier this month indicating the need for 8lpm with exertion. Advised pt we will call [facility] in the morning (they are currently closed) to inform them and to get pt the needed O2 for activity. Pt verbalized understanding. Will follow up on 02/20/2019.

## 2019-02-19 NOTE — Telephone Encounter (Signed)
Called patient, call went straight to voicemail. Left message for patient to call back.

## 2019-02-20 NOTE — Telephone Encounter (Signed)
I called [facility] and was on hold for over 10 mins. Will try again later.

## 2019-02-25 NOTE — Telephone Encounter (Signed)
I was on hold with [facility] for over 10 min. I hung up and called the patient to follow up and see if he had heard from anyone and he states that he has gotten it all straightened out with [employer]. Nothing further is needed.

## 2019-02-26 ENCOUNTER — Ambulatory Visit: Payer: Medicare HMO | Admitting: Cardiology

## 2019-02-28 ENCOUNTER — Encounter: Payer: Self-pay | Admitting: Cardiology

## 2019-02-28 ENCOUNTER — Ambulatory Visit (INDEPENDENT_AMBULATORY_CARE_PROVIDER_SITE_OTHER): Payer: Medicare HMO | Admitting: Cardiology

## 2019-02-28 ENCOUNTER — Other Ambulatory Visit: Payer: Self-pay

## 2019-02-28 VITALS — BP 140/70 | HR 71 | Ht 74.0 in | Wt 248.0 lb

## 2019-02-28 DIAGNOSIS — J8410 Pulmonary fibrosis, unspecified: Secondary | ICD-10-CM

## 2019-02-28 DIAGNOSIS — J849 Interstitial pulmonary disease, unspecified: Secondary | ICD-10-CM | POA: Diagnosis not present

## 2019-02-28 DIAGNOSIS — I7121 Aneurysm of the ascending aorta, without rupture: Secondary | ICD-10-CM | POA: Insufficient documentation

## 2019-02-28 DIAGNOSIS — E785 Hyperlipidemia, unspecified: Secondary | ICD-10-CM | POA: Diagnosis not present

## 2019-02-28 DIAGNOSIS — I25119 Atherosclerotic heart disease of native coronary artery with unspecified angina pectoris: Secondary | ICD-10-CM | POA: Diagnosis not present

## 2019-02-28 DIAGNOSIS — I712 Thoracic aortic aneurysm, without rupture: Secondary | ICD-10-CM

## 2019-02-28 MED ORDER — NITROGLYCERIN 0.4 MG SL SUBL: 0.4000 mg | SUBLINGUAL_TABLET | SUBLINGUAL | 10 refills | Status: AC | PRN

## 2019-02-28 NOTE — Patient Instructions (Addendum)
Medication Instructions:  Your physician has recommended you make the following change in your medication:   TAKE as needed for chest pain: Nitroglycerin 0.4 mg sublingual (under your tongue) as needed for chest pain. If experiencing chest pain, stop what you are doing and sit down. Take 1 nitroglycerin and wait 5 minutes. If chest pain continues, take another nitroglycerin and wait 5 minutes. If chest pain does not subside, take 1 more nitroglycerin and dial 911. You make take a total of 3 nitroglycerin in a 15 minute time frame.  *If you need a refill on your cardiac medications before your next appointment, please call your pharmacy*  Lab Work: None.  If you have labs (blood work) drawn today and your tests are completely normal, you will receive your results only by: Marland Kitchen. MyChart Message (if you have MyChart) OR . A paper copy in the mail If you have any lab test that is abnormal or we need to change your treatment, we will call you to review the results.  Testing/Procedures: Your physician has requested that you have an echocardiogram. Echocardiography is a painless test that uses sound waves to create images of your heart. It provides your doctor with information about the size and shape of your heart and how well your heart's chambers and valves are working. This procedure takes approximately one hour. There are no restrictions for this procedure.    Follow-Up: At [facility], you and your health needs are our priority.  As part of our continuing mission to provide you with exceptional heart care, we have created designated Provider Care Teams.  These Care Teams include your primary Cardiologist (physician) and Advanced Practice Providers (APPs -  Physician Assistants and Nurse Practitioners) who all work together to provide you with the care you need, when you need it.  Your next appointment:   6 month(s)  The format for your next appointment:   In Person  Provider:   You may  see Dr. Bing MatterKrasowski or the following Advanced Practice Provider on your designated Care Team:    Gillian Shieldsaitlin Walker, FNP   Other Instructions  Nitroglycerin sublingual tablets What is this medicine? NITROGLYCERIN (nye troe GLI ser in) is a type of vasodilator. It relaxes blood vessels, increasing the blood and oxygen supply to your heart. This medicine is used to relieve chest pain caused by angina. It is also used to prevent chest pain before activities like climbing stairs, going outdoors in cold weather, or sexual activity. This medicine may be used for other purposes; ask your health care provider or pharmacist if you have questions. COMMON BRAND NAME(S): Nitroquick, Nitrostat, Nitrotab What should I tell my health care provider before I take this medicine? They need to know if you have any of these conditions:  anemia  head injury, recent stroke, or bleeding in the brain  liver disease  previous heart attack  an unusual or allergic reaction to nitroglycerin, other medicines, foods, dyes, or preservatives  pregnant or trying to get pregnant  breast-feeding How should I use this medicine? Take this medicine by mouth as needed. At the first sign of an angina attack (chest pain or tightness) place one tablet under your tongue. You can also take this medicine 5 to 10 minutes before an event likely to produce chest pain. Follow the directions on the prescription label. Let the tablet dissolve under the tongue. Do not swallow whole. Replace the dose if you accidentally swallow it. It will help if your mouth is not dry.  Saliva around the tablet will help it to dissolve more quickly. Do not eat or drink, smoke or chew tobacco while a tablet is dissolving. If you are not better within 5 minutes after taking ONE dose of nitroglycerin, call 9-1-1 immediately to seek emergency medical care. Do not take more than 3 nitroglycerin tablets over 15 minutes. If you take this medicine often to relieve  symptoms of angina, your doctor or health care professional may provide you with different instructions to manage your symptoms. If symptoms do not go away after following these instructions, it is important to call 9-1-1 immediately. Do not take more than 3 nitroglycerin tablets over 15 minutes. Talk to your pediatrician regarding the use of this medicine in children. Special care may be needed. Overdosage: If you think you have taken too much of this medicine contact a poison control center or emergency room at once. NOTE: This medicine is only for you. Do not share this medicine with others. What if I miss a dose? This does not apply. This medicine is only used as needed. What may interact with this medicine? Do not take this medicine with any of the following medications:  certain migraine medicines like ergotamine and dihydroergotamine (DHE)  medicines used to treat erectile dysfunction like sildenafil, tadalafil, and vardenafil  riociguat This medicine may also interact with the following medications:  alteplase  aspirin  heparin  medicines for high blood pressure  medicines for mental depression  other medicines used to treat angina  phenothiazines like chlorpromazine, mesoridazine, prochlorperazine, thioridazine This list may not describe all possible interactions. Give your health care provider a list of all the medicines, herbs, non-prescription drugs, or dietary supplements you use. Also tell them if you smoke, drink alcohol, or use illegal drugs. Some items may interact with your medicine. What should I watch for while using this medicine? Tell your doctor or health care professional if you feel your medicine is no longer working. Keep this medicine with you at all times. Sit or lie down when you take your medicine to prevent falling if you feel dizzy or faint after using it. Try to remain calm. This will help you to feel better faster. If you feel dizzy, take several deep  breaths and lie down with your feet propped up, or bend forward with your head resting between your knees. You may get drowsy or dizzy. Do not drive, use machinery, or do anything that needs mental alertness until you know how this drug affects you. Do not stand or sit up quickly, especially if you are an older patient. This reduces the risk of dizzy or fainting spells. Alcohol can make you more drowsy and dizzy. Avoid alcoholic drinks. Do not treat yourself for coughs, colds, or pain while you are taking this medicine without asking your doctor or health care professional for advice. Some ingredients may increase your blood pressure. What side effects may I notice from receiving this medicine? Side effects that you should report to your doctor or health care professional as soon as possible:  blurred vision  dry mouth  skin rash  sweating  the feeling of extreme pressure in the head  unusually weak or tired Side effects that usually do not require medical attention (report to your doctor or health care professional if they continue or are bothersome):  flushing of the face or neck  headache  irregular heartbeat, palpitations  nausea, vomiting This list may not describe all possible side effects. Call your doctor for medical  advice about side effects. You may report side effects to FDA at 1-800-FDA-1088. Where should I keep my medicine? Keep out of the reach of children. Store at room temperature between 20 and 25 degrees C (68 and 77 degrees F). Store in Retail buyer. Protect from light and moisture. Keep tightly closed. Throw away any unused medicine after the expiration date. NOTE: This sheet is a summary. It may not cover all possible information. If you have questions about this medicine, talk to your doctor, pharmacist, or health care provider.  2020 Elsevier/Gold Standard (2013-01-10 17:57:36)   Echocardiogram An echocardiogram is a procedure that uses painless sound  waves (ultrasound) to produce an image of the heart. Images from an echocardiogram can provide important information about:  Signs of coronary artery disease (CAD).  Aneurysm detection. An aneurysm is a weak or damaged part of an artery wall that bulges out from the normal force of blood pumping through the body.  Heart size and shape. Changes in the size or shape of the heart can be associated with certain conditions, including heart failure, aneurysm, and CAD.  Heart muscle function.  Heart valve function.  Signs of a past heart attack.  Fluid buildup around the heart.  Thickening of the heart muscle.  A tumor or infectious growth around the heart valves. Tell a health care provider about:  Any allergies you have.  All medicines you are taking, including vitamins, herbs, eye drops, creams, and over-the-counter medicines.  Any blood disorders you have.  Any surgeries you have had.  Any medical conditions you have.  Whether you are pregnant or may be pregnant. What are the risks? Generally, this is a safe procedure. However, problems may occur, including:  Allergic reaction to dye (contrast) that may be used during the procedure. What happens before the procedure? No specific preparation is needed. You may eat and drink normally. What happens during the procedure?   An IV tube may be inserted into one of your veins.  You may receive contrast through this tube. A contrast is an injection that improves the quality of the pictures from your heart.  A gel will be applied to your chest.  A wand-like tool (transducer) will be moved over your chest. The gel will help to transmit the sound waves from the transducer.  The sound waves will harmlessly bounce off of your heart to allow the heart images to be captured in real-time motion. The images will be recorded on a computer. The procedure may vary among health care providers and hospitals. What happens after the  procedure?  You may return to your normal, everyday life, including diet, activities, and medicines, unless your health care provider tells you not to do that. Summary  An echocardiogram is a procedure that uses painless sound waves (ultrasound) to produce an image of the heart.  Images from an echocardiogram can provide important information about the size and shape of your heart, heart muscle function, heart valve function, and fluid buildup around your heart.  You do not need to do anything to prepare before this procedure. You may eat and drink normally.  After the echocardiogram is completed, you may return to your normal, everyday life, unless your health care provider tells you not to do that. This information is not intended to replace advice given to you by your health care provider. Make sure you discuss any questions you have with your health care provider. Document Released: 03/11/2000 Document Revised: 07/05/2018 Document Reviewed: 04/16/2016 Elsevier Patient  Education  [employer].

## 2019-02-28 NOTE — Progress Notes (Signed)
Cardiology Office Note:    Date:  02/28/2019   ID:  Brigido, Mera 11-13-1945, MRN [id number]  PCP:  Sandi Mariscal, MD  Cardiologist:  Jenne Campus, MD    Referring MD: Sandi Mariscal, MD   Chief Complaint  Patient presents with  . Follow-up  Doing well  History of Present Illness:    Bruce Winters is a 73 y.o. male with coronary artery disease status post PTCA and stenting years ago proximal LAD.  Also pulmonary fibrosis.  Comes today to my office for follow-up of all seems to be doing well.  Shortness of breath gradually slowly getting worse and when I asked him about comparing himself with himself a year ago he said slightly worse.  He is usually at 4 L of oxygen at rest.  When he walks around he use 8 L.  Still trying to do activity of daily living.  Described 1 episode of chest pain that happened weeks ago when he was washing dishes he started having some sensation on the left side he had to sit down he was thinking about taking nitroglycerin but before he decided to do it pain is subsided.  Since that time no more pain.  Past Medical History:  Diagnosis Date  . Heart attack ([place])   . Hyperlipidemia   . OSA (obstructive sleep apnea)    on CPAP     Past Surgical History:  Procedure Laterality Date  . ANGIOPLASTY    . CHOLECYSTECTOMY    . COLON SURGERY      Current Medications: Current Meds  Medication Sig  . albuterol (PROVENTIL) (2.5 MG/3ML) 0.083% nebulizer solution Take 2.5 mg by nebulization every 6 (six) hours as needed for wheezing or shortness of breath.  Marland Kitchen albuterol (VENTOLIN HFA) 108 (90 Base) MCG/ACT inhaler Inhale 2 puffs into the lungs every 6 (six) hours as needed for wheezing or shortness of breath.  Marland Kitchen amLODipine (NORVASC) 5 MG tablet Take 5 mg by mouth daily.  Marland Kitchen aspirin 81 MG tablet Take 81 mg by mouth daily.  . colestipol (COLESTID) 1 g tablet Take 1 g by mouth 2 (two) times daily. As needed  . memantine (NAMENDA) 10 MG tablet Take 20 mg by mouth  at bedtime.  . Multiple Vitamins-Minerals (CENTRUM PO) Take 1 tablet by mouth daily.  . Nintedanib (OFEV) 150 MG CAPS Take 1 tablet by mouth 2 (two) times daily.  . traZODone (DESYREL) 100 MG tablet Take 100 mg by mouth at bedtime as needed for sleep.      Allergies:   Penicillins   Social History   Socioeconomic History  . Marital status: Widowed    Spouse name: Not on file  . Number of children: Not on file  . Years of education: Not on file  . Highest education level: Not on file  Occupational History  . Not on file  Social Needs  . Financial resource strain: Not on file  . Food insecurity    Worry: Not on file    Inability: Not on file  . Transportation needs    Medical: Not on file    Non-medical: Not on file  Tobacco Use  . Smoking status: Former Smoker    Packs/day: 1.50    Years: 15.00    Pack years: 22.50    Types: Cigarettes    Quit date: 03/28/1994    Years since quitting: 24.9  . Smokeless tobacco: Never Used  Substance and Sexual Activity  . Alcohol use: No  Alcohol/week: 0.0 standard drinks  . Drug use: No  . Sexual activity: Not on file  Lifestyle  . Physical activity    Days per week: Not on file    Minutes per session: Not on file  . Stress: Not on file  Relationships  . Social Musician on phone: Not on file    Gets together: Not on file    Attends religious service: Not on file    Active member of club or organization: Not on file    Attends meetings of clubs or organizations: Not on file    Relationship status: Not on file  Other Topics Concern  . Not on file  Social History Narrative  . Not on file     Family History: The patient's family history is not on file. ROS:   Please see the history of present illness.    All 14 point review of systems negative except as described per history of present illness  EKGs/Labs/Other Studies Reviewed:      Recent Labs: 01/30/2019: ALT 13; BUN 14; Creatinine, Ser 1.00; Hemoglobin  13.5; Platelets 252.0; Potassium 4.3; Pro B Natriuretic peptide (BNP) 61.0; Sodium 135  Recent Lipid Panel    Component Value Date/Time   CHOL 195 01/02/2011 0532   TRIG 157 (H) 01/02/2011 0532   HDL 37 (L) 01/02/2011 0532   CHOLHDL 5.3 01/02/2011 0532   VLDL 31 01/02/2011 0532   LDLCALC 127 (H) 01/02/2011 0532    Physical Exam:    VS:  BP 140/70   Pulse 71   Ht 6\' 2"  (1.88 m)   Wt 248 lb (112.5 kg)   SpO2 97% Comment: 4 lt of O2  BMI 31.84 kg/m     Wt Readings from Last 3 Encounters:  02/28/19 248 lb (112.5 kg)  01/30/19 249 lb (112.9 kg)  12/24/18 245 lb (111.1 kg)     GEN:  Well nourished, well developed in no acute distress HEENT: Normal NECK: No JVD; No carotid bruits LYMPHATICS: No lymphadenopathy CARDIAC: RRR, no murmurs, no rubs, no gallops RESPIRATORY:  Clear to auscultation without rales, wheezing or rhonchi  ABDOMEN: Soft, non-tender, non-distended MUSCULOSKELETAL:  No edema; No deformity  SKIN: Warm and dry LOWER EXTREMITIES: no swelling NEUROLOGIC:  Alert and oriented x 3 PSYCHIATRIC:  Normal affect   ASSESSMENT:    1. Coronary artery disease involving native coronary artery of native heart with angina pectoris (HCC)   2. Dyslipidemia   3. ILD (interstitial lung disease) (HCC)   4. PULMONARY FIBROSIS   5. Ascending aortic aneurysm (HCC) 44 mm in February 2019    PLAN:    In order of problems listed above:  1. Coronary disease 1 episode of chest pain.  I will make sure he got prescription for nitroglycerin he showed me nitroglycerin that he got with him and this is an old nitroglycerin he need to have in your prescription. 2. Dyslipidemia we will contact primary care physician to get his fasting lipid profile. 3. Interstitial lung disease with pulmonary fibrosis followed by pulmonary gradually getting worse. 4. Ascending octagon was 44 mm in February 2019.  We will schedule him to have another echocardiogram February to revisit that issue.    Medication Adjustments/Labs and Tests Ordered: Current medicines are reviewed at length with the patient today.  Concerns regarding medicines are outlined above.  No orders of the defined types were placed in this encounter.  Medication changes: No orders of the defined types were placed  in this encounter.   Signed, Georgeanna Leaobert J. Sunnie Odden, MD, [facility] Hospital[facility]ACC 02/28/2019 10:05 AM    [facility]

## 2019-02-28 NOTE — Addendum Note (Signed)
Addended by: Ashok Norris on: 02/28/2019 10:11 AM   Modules accepted: Orders

## 2019-04-29 ENCOUNTER — Ambulatory Visit (HOSPITAL_BASED_OUTPATIENT_CLINIC_OR_DEPARTMENT_OTHER)
Admission: RE | Admit: 2019-04-29 | Discharge: 2019-04-29 | Disposition: A | Discharge status: Home / Self Care | Payer: Medicare HMO | Source: Ambulatory Visit | Attending: Cardiology | Admitting: Cardiology

## 2019-04-29 ENCOUNTER — Other Ambulatory Visit: Payer: Self-pay

## 2019-04-29 DIAGNOSIS — I25119 Atherosclerotic heart disease of native coronary artery with unspecified angina pectoris: Secondary | ICD-10-CM | POA: Diagnosis not present

## 2019-04-29 NOTE — Progress Notes (Signed)
  Echocardiogram 2D Echocardiogram has been performed.  Sinda Du 04/29/2019, 8:56 AM

## 2019-07-11 NOTE — Progress Notes (Signed)
@Patient  ID: Bruce Winters, male    DOB: Jun 04, 1945, 74 y.o.   MRN: [id number]  Chief Complaint  Patient presents with  . Follow-up    COPD oxygen dependent.  increase in sob especially with exertion, bathing and dressing.    Referring provider: Sandi Mariscal, MD  HPI:  74 year old male former smoker followed in our office for interstitial lung disease, chronic respiratory failure and obstructive sleep apnea  PMH: Congestive heart failure, hypertension, dyslipidemia Smoker/ Smoking History: Former smoker.  Quit 1996.  22.5-pack-year smoking history. Maintenance: none  [name]: Dr. Elsworth Soho  07/12/2019  - Visit   74 year old male former smoker followed in our office for interstitial lung disease and chronic respiratory failure.  Patient also has obstructive sleep apnea which is managed by the [place].  Patient was last seen in our office in November/2020 at that time patient was requesting to reestablish with [facility] pulmonary.  Our office work to coordinate this.  This is directly related to the fact the patient did not want to seek emergent care in [facility] health facilities.  Per chart review patient no showed for this office visit with [facility] pulmonary.  He has not followed up with them.  Patient is presenting today as an acute visit reporting that he is having exertional hypoxemia.  When walked in our office in November/2020 patient required 5 L at baseline and 8 L with physical exertion.  On walk today patient required the same.  Patient reporting that he is unsure "why he is so short of breath".  Patient presents today with a friend who is also trying to help.  His friend's name is: Bruce Winters [phone]).  She is presenting today at the patient's request and also present in the office visit to try to help with coordinating his care.  She is concerned that she feels that his breathing is worsening.  We will discuss and review this.  Patient has been trialed on antifibrotic's in the  past.  He was found to be intolerant of both.  Per patient and chart review he had violent dreams when taking Esbriet.  He is not interested in resuming.  He also had significant diarrhea and fatigue with Ofev which he does not want to continue.  His last CT imaging that we have on file was in 2019.  Breathing test was from 2018 which showed a severe diffusion defect.   Questionaires / Pulmonary Flowsheets:   MMRC: mMRC Dyspnea Scale mMRC Score  07/12/2019 4    Tests:   Chart Review:   12/25/2017- office visit-[facility] critical care-Dr. Early Osmond History from this office note: Patient had started Esbriet and discontinued due to violent dreams.  He also had symptoms of diarrhea. Plan: Patient to start 0FEV #1 interstitial lung disease with previous diagnosis of CT evidence of NSIP. Concerns in regards to the patient's CAT scan demonstrates multiple emphysematous changes and cyst which would be concerning for a possible overlap with L IP. Regardless the patient does have a fibrotic component to his interstitial disease and would recommend ongoing anti-fibrotic therapy. However this is complicated by the patient's underlying dementia and difficulty tolerating Esbriet in which the patient had developed violent dreams and a feeling of wanting to hurt someone. Patient subsequently discontinued therapy with resolution of symptoms. We will obtain liver panel and ammonia level at this time. Patient will be placed on nintedanib for further management of his interstitial disease  03/04/2019 - next appt -  with [facility] pulm - Dr. [name] >>> No showed  Tests:   08/15/2017- connective tissue work-up ANA titer-negative Double-stranded DNA AMB-negative Sjogren's syndrome antibodies-negative Anti-Jo 1-negative Hepatic function panel-stable  05/25/2016-CT chest high-res-pulmonary parenchymal pattern of ILD appears stable from prior exams favoring fibrotic NSIP, superimposed on  moderate to severe emphysema  08/10/2017-CT chest high-res- progressive interstitial lung disease better with typical UIP CT pattern as above, diffuse bronchial wall thickening with moderate to severe centrilobular and paraseptal emphysema, pattern of mid to upper lung micro nodularity much of which is calcified again suggestive of superimposed silicosis  05/09/2018-echocardiogram- LV ejection fraction 55 to 60%, right ventricle is normal systolic function, cavity was normal, no increase in right ventricle wall thickness  08/15/2016-pulmonary function test- FVC 4.02 (79% predicted), postbronchodilator ratio 81, postbronchodilator FEV1 3.34 (90% predicted), no bronchodilator response, mid flow reversibility, DLCO 9.54 (25% predicted)  FENO:  No results found for: NITRICOXIDE  PFT: PFT Results Latest Ref Rng & Units 08/15/2017 05/20/2016 04/14/2015  FVC-Pre L 4.02 4.42 4.25  FVC-Predicted Pre % 79 86 85  FVC-Post L 4.11 4.41 4.49  FVC-Predicted Post % 81 86 90  Pre FEV1/FVC % % 80 78 84  Post FEV1/FCV % % 81 80 81  FEV1-Pre L 3.23 3.45 3.56  FEV1-Predicted Pre % 87 92 97  FEV1-Post L 3.34 3.53 3.63  DLCO UNC% % 25 32 44  DLCO COR %Predicted % 36 45 60  TLC L 4.17 6.65 -  TLC % Predicted % 53 84 -  RV % Predicted % 15 84 -    WALK:  SIX MIN WALK 07/12/2019 01/30/2019 12/24/2018 07/31/2017 07/06/2016 05/20/2016 10/19/2015  Supplimental Oxygen during Test? (L/min) Yes Yes Yes No No - No  O2 Flow Rate 6 8 4  - - - -  Type Continuous Continuous Pulse - - - -  Tech Comments: after 1/2 lap sats decreased to 89% 6lpm cont o2-- increased o2 flow to 8lpm and sats increased to 94% and pt walked additional 1/2 lap with sats ending at 95% 8lpm, pulse 100. Pt walked average pace and stopped to rest due to SOB x 2//lmr Patient was very short of breath, needing to slow pace, catch breath and adjust oxygen during walk. Patient wanted to stay on pulsed but needs 8 L of continuous flow.//btr Walked 1 lap, had to stop  and rest 1 time before returning to the room. Patient only completed 1 lap. O2 dropped down to 84% O2 increased to 88%.  Pt walked at moderate pace with steady gait.  fast steady walk/no complications/TA/CMA no complaints    Imaging: No results found.  Lab Results:  CBC    Component Value Date/Time   WBC 9.1 01/30/2019 1122   RBC 4.42 01/30/2019 1122   HGB 13.5 01/30/2019 1122   HCT 40.1 01/30/2019 1122   PLT 252.0 01/30/2019 1122   MCV 90.8 01/30/2019 1122   MCH 31.0 01/04/2011 0420   MCHC 33.6 01/30/2019 1122   RDW 14.2 01/30/2019 1122   LYMPHSABS 1.3 01/30/2019 1122   MONOABS 0.7 01/30/2019 1122   EOSABS 0.1 01/30/2019 1122   BASOSABS 0.1 01/30/2019 1122    BMET    Component Value Date/Time   NA 135 01/30/2019 1122   K 4.3 01/30/2019 1122   CL 97 01/30/2019 1122   CO2 31 01/30/2019 1122   GLUCOSE 95 01/30/2019 1122   BUN 14 01/30/2019 1122   CREATININE 1.00 01/30/2019 1122   CALCIUM 9.5 01/30/2019 1122   GFRNONAA >90 01/04/2011  0420   GFRAA >90 01/04/2011 0420    BNP No results found for: BNP  ProBNP    Component Value Date/Time   PROBNP 61.0 01/30/2019 1122    Specialty Problems      Pulmonary Problems   PULMONARY NODULE, RIGHT MIDDLE LOBE    Qualifier: Diagnosis of  By: Truman Hayward Duncan Dull), Susanne        COPD GOLD 0    Quit smoking 1996  Emphysema on CT , but no obs on PFT       PULMONARY FIBROSIS    First reported on CT 05/2007  - 01/01/2015  Walked RA x 3 laps @ 185 ft each stopped due to sob/ cough nl pace  CT chest 12/2014 >New prominent dense peribronchovascular nodularity in the upper lobes, 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 2. 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 3. 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 Penicillins Anaphylaxis    Immunization History  Administered Date(s) Administered  . 19-influenza Whole 11/23/2018  . Influenza Split 11/27/2014  . Influenza, High Dose Seasonal PF 12/12/2016, 11/28/2018  . Influenza, Seasonal, Injecte, Preservative Fre 01/27/2016  . Influenza,inj,Quad PF,6+ Mos 01/27/2016  . Moderna SARS-COVID-2 Vaccination 05/10/2019, 06/07/2019  . Pneumococcal Conjugate-13 12/12/2016  . Zoster Recombinat (Shingrix) 07/11/2017, 09/13/2017    Past Medical History:  Diagnosis Date  . Heart attack (HCC)   . Hyperlipidemia   . OSA (obstructive sleep apnea)    on CPAP     Tobacco History: Social History   Tobacco Use  Smoking Status Former Smoker  . Packs/day: 1.50  . Years: 15.00  . Pack years: 22.50  . Types: Cigarettes  . Quit date: 03/28/1994  . Years since quitting: 25.3  Smokeless Tobacco Never Used   Counseling given: Not Answered   Continue  to not smoke  Outpatient Encounter Medications as of 07/12/2019  Medication Sig  . albuterol (PROVENTIL) (2.5 MG/3ML) 0.083% nebulizer solution Take 2.5 mg by nebulization every 6 (six) hours as needed for wheezing or shortness of breath.  Marland Kitchen albuterol (VENTOLIN HFA) 108 (90 Base) MCG/ACT inhaler Inhale 2 puffs into the lungs every 6 (six) hours as needed for wheezing or shortness of breath.  Marland Kitchen  amLODipine (NORVASC) 5 MG tablet Take 5 mg by mouth daily.  Marland Kitchen. aspirin 81 MG tablet Take 81 mg by mouth daily.  . colestipol (COLESTID) 1 g tablet Take 1 g by mouth 2 (two) times daily. As needed  . memantine (NAMENDA) 10 MG tablet Take 20 mg by mouth at bedtime.  . Multiple Vitamins-Minerals (CENTRUM PO) Take 1 tablet by mouth daily.  . Nintedanib (OFEV) 150 MG CAPS Take 1 tablet by mouth 2 (two) times daily.  . traZODone (DESYREL) 100 MG tablet Take 100 mg by mouth at bedtime as needed for sleep.   . nitroGLYCERIN (NITROSTAT) 0.4 MG SL tablet Place 1 tablet (0.4 mg total) under the tongue every 5 (five) minutes as needed for chest pain.   No facility-administered encounter medications on file as of 07/12/2019.     Review of Systems  Review of Systems  Constitutional: Positive for fatigue. Negative for activity change, chills, fever and unexpected weight change.  HENT: Negative for postnasal drip, rhinorrhea, sinus pressure, sinus pain and sore throat.   Eyes: Negative.   Respiratory: Positive for shortness of breath. Negative for cough and wheezing.   Cardiovascular: Negative for chest pain and palpitations.  Gastrointestinal: Negative for constipation, diarrhea, nausea and vomiting.  Endocrine: Negative.   Genitourinary: Negative.   Musculoskeletal: Negative.   Skin: Negative.   Neurological: Negative for dizziness and headaches.  Psychiatric/Behavioral: Positive for confusion. Negative for dysphoric mood. The patient is not nervous/anxious.   All other systems reviewed and are negative.     Physical Exam  BP 130/70 (BP Location: Left Arm, Cuff Size: Normal)   Pulse (!) 101   Temp 97.6 F (36.4 C) (Temporal)   Ht 6\' 2"  (1.88 m)   Wt 237 lb 9.6 oz (107.8 kg)   SpO2 99%   BMI 30.51 kg/m   Wt Readings from Last 5 Encounters:  07/12/19 237 lb 9.6 oz (107.8 kg)  02/28/19 248 lb (112.5 kg)  01/30/19 249 lb (112.9 kg)  12/24/18 245 lb (111.1 kg)  04/26/18 253 lb (114.8 kg)    BMI Readings from Last 5 Encounters:  07/12/19 30.51 kg/m  02/28/19 31.84 kg/m  01/30/19 31.97 kg/m  12/24/18 31.46 kg/m  04/26/18 33.38 kg/m     Physical Exam Vitals and nursing note reviewed.  Constitutional:      General: He is not in acute distress.    Appearance: Normal appearance. He is obese.  HENT:     Head: Normocephalic and atraumatic.     Right Ear: Hearing, tympanic membrane, ear canal and external ear normal.     Left Ear: Hearing, tympanic membrane, ear canal and external ear normal.     Nose: Nose normal. No mucosal edema or rhinorrhea.     Right Turbinates: Not enlarged.     Left Turbinates: Not enlarged.     Mouth/Throat:     Mouth: Mucous membranes are dry.     Pharynx: Oropharynx is clear. No oropharyngeal exudate.  Eyes:     Pupils: Pupils are equal, round, and reactive to light.  Cardiovascular:     Rate and Rhythm: Normal rate and regular rhythm.     Pulses: Normal pulses.     Heart sounds: Normal heart sounds. No murmur.  Pulmonary:     Effort: Pulmonary effort is normal.     Breath sounds: Rales (bibasilar crackles ) present. No decreased breath sounds or wheezing.  Abdominal:     General: Bowel sounds are normal. There is no distension.  Palpations: Abdomen is soft.     Tenderness: There is no abdominal tenderness.     Comments: obese  Musculoskeletal:     Cervical back: Normal range of motion.     Right lower leg: No edema.     Left lower leg: No edema.  Lymphadenopathy:     Cervical: No cervical adenopathy.  Skin:    General: Skin is  warm and dry.     Capillary Refill: Capillary refill takes less than 2 seconds.     Findings: No erythema or rash.  Neurological:     General: No focal deficit present.     Mental Status: He is alert and oriented to person, place, and time. Mental status is at baseline.     Motor: No weakness.     Coordination: Coordination normal.     Gait: Gait is intact. Gait (tolerated walk in office) normal.  Psychiatric:        Mood and Affect: Mood normal.        Behavior: Behavior normal. Behavior is cooperative.        Thought Content: Thought content normal.        Cognition and Memory: Cognition is impaired. Memory is impaired.        Judgment: Judgment normal.     Comments: Patient reports that he has chronic confusion and memory issues which affects his ability to maintain medications as well as keep follow-up with offices.       Assessment & Plan:   Coordination of complex care This continues to be an ongoing barrier for the patient Patient struggles with follow-ups He never completed follow-up with [facility] pulmonary Thankfully he brought a friend with him today Friends name: Bruce Winters Patient continues to struggle with acute care follow-up with [place] system due to previous history of having an issue with the cardiology team  Discussion: I had a very honest and frank discussion with the patient today.  I explained that if he is going to establish with our pulmonary team and to continue to follow-up with [place] then he needs to be willing to be seen in acute care facility with [facility] health.  This will allow our pulmonary team to also see him when hospitalized.  It continues to be a struggle that the patient sees the [place], [place] health, [facility] as well as our team intermittently and with no regular follow-up.  The patient is committed to follow-up with our office as well as be willing to be seen at a [place] facility if he needs to seek acute care so that way our  pulmonary team can manage.  Plan: 4-week follow-up with our office  ILD (interstitial lung disease) (HCC) Last seen in November/2020 At patient's request we referred to [facility] pulmonary, patient never completed this follow-up At last office visit patient was informed he needs 8 L continuous with physical exertion Patient presenting on 5 L and telling me he has no idea what he needs to use with physical exertion 2019 high-resolution CT chest showed progression of UIP fibrosis Pulmonary function testing from 2018 shows a DLCO 9.54 (25% predicted) Patient is intolerant of antifibrotic's-has been tried on Ofev as well as Esbriet  Plan: High-resolution CT chest ordered today Close follow-up with our office Referred to hospice for end-stage care as well as in-home help and support 4-week follow-up with Dr. 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 [name]  I also contacted hospice/palliative care RN Haynes Bast regarding the patient.  She will coordinate contact with the patient as well as discussed the options between palliative care and hospice.  They will update our team as needed.   Return in about 4 weeks (around 08/09/2019), or if symptoms worsen or fail to improve, for Follow up with Dr. [name], After Chest CT.   Coral Ceo, NP 07/12/2019   This appointment required 50 minutes of patient care (this includes precharting, chart review, review of results, face-to-face care, etc.).

## 2019-07-12 ENCOUNTER — Other Ambulatory Visit: Payer: Self-pay

## 2019-07-12 ENCOUNTER — Ambulatory Visit: Payer: Medicare HMO | Admitting: Pulmonary Disease

## 2019-07-12 ENCOUNTER — Encounter: Payer: Self-pay | Admitting: Pulmonary Disease

## 2019-07-12 VITALS — BP 130/70 | HR 101 | Temp 97.6°F | Ht 74.0 in | Wt 237.6 lb

## 2019-07-12 DIAGNOSIS — G4733 Obstructive sleep apnea (adult) (pediatric): Secondary | ICD-10-CM

## 2019-07-12 DIAGNOSIS — R0602 Shortness of breath: Secondary | ICD-10-CM

## 2019-07-12 DIAGNOSIS — J9611 Chronic respiratory failure with hypoxia: Secondary | ICD-10-CM

## 2019-07-12 DIAGNOSIS — Z7189 Other specified counseling: Secondary | ICD-10-CM

## 2019-07-12 DIAGNOSIS — Z9989 Dependence on other enabling machines and devices: Secondary | ICD-10-CM

## 2019-07-12 DIAGNOSIS — J849 Interstitial pulmonary disease, unspecified: Secondary | ICD-10-CM | POA: Diagnosis not present

## 2019-07-12 NOTE — Patient Instructions (Addendum)
You were seen today by Lauraine Rinne, NP  for:   1. ILD (interstitial lung disease) ([place])  - Ambulatory referral to Hospice  We will refer you to hospice, will discuss with your intake team to talk over the options with you have either palliative care or hospice therapy.  I believe both would be beneficial for you.  We will further evaluate your scarring on your lungs with a high-resolution CT chest.  We will see you back in 4 weeks after completing that CT with Dr. Elsworth Soho.  2. Shortness of breath  - CT Chest High Resolution; Future  3. Chronic respiratory failure with hypoxia (HCC)  Continue oxygen therapy as prescribed - 5L with rest, 8L with exertion  >>>maintain oxygen saturations greater than 88 percent  >>>if unable to maintain oxygen saturations please contact the office  >>>do not smoke with oxygen  >>>can use nasal saline gel or nasal saline rinses to moisturize nose if oxygen causes dryness  4. Coordination of complex care  Please contact our office immediately if you are having worsening shortness of breath or if you have questions regarding your appointments  5. OSA on CPAP  Continue CPAP    We recommend today:  Orders Placed This Encounter  Procedures  . CT Chest High Resolution    Standing Status:   Future    Standing Expiration Date:   09/10/2020    Scheduling Instructions:     Schedule asap - [place]    Order Specific Question:   Preferred imaging location?    Answer:   [facility]    Order Specific Question:   Radiology Contrast Protocol - do NOT remove file path    Answer:   [email or web address]  . Ambulatory referral to Hospice    Referral Priority:   Routine    Referral Type:   Consultation    Referral Reason:   Specialty Services Required    Requested Specialty:   Hospice Services    Number of Visits Requested:   1   Orders Placed This Encounter  Procedures  . CT Chest High Resolution  .  Ambulatory referral to Hospice   No orders of the defined types were placed in this encounter.   Follow Up:    Return in about 4 weeks (around 08/09/2019), or if symptoms worsen or fail to improve, for Follow up with Dr. Elsworth Soho, After Chest CT.   Please do your part to reduce the spread of COVID-19:      Reduce your risk of any infection  and COVID19 by using the similar precautions used for avoiding the common cold or flu:  Marland Kitchen Wash your hands often with soap and warm water for at least 20 seconds.  If soap and water are not readily available, use an alcohol-based hand sanitizer with at least 60% alcohol.  . If coughing or sneezing, cover your mouth and nose by coughing or sneezing into the elbow areas of your shirt or coat, into a tissue or into your sleeve (not your hands). Langley Gauss A MASK when in public  . Avoid shaking hands with others and consider head nods or verbal greetings only. . Avoid touching your eyes, nose, or mouth with unwashed hands.  . Avoid close contact with people who are sick. . Avoid places or events with large numbers of people in one location, like concerts or sporting events. . If you have some symptoms but not all symptoms, continue to  monitor at home and seek medical attention if your symptoms worsen. . If you are having a medical emergency, call 911.   ADDITIONAL HEALTHCARE OPTIONS FOR PATIENTS  [facility] / e-Visit: [email or web address]         [facility]: [phone]  [facility]: [phone]                   [facility]: [phone]     It is flu season:   >>> Best ways to protect herself from the flu: Receive the yearly flu vaccine, practice good hand hygiene washing with soap and also using hand sanitizer when available, eat a nutritious meals, get adequate rest, hydrate appropriately   Please contact the office if your symptoms worsen or you have  concerns that you are not improving.   Thank you for choosing [facility] for your healthcare, and for allowing [place] to partner with you on your healthcare journey. I am thankful to be able to provide care to you today.   Elisha Headland FNP-C [facility]

## 2019-07-12 NOTE — Assessment & Plan Note (Signed)
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 [name]

## 2019-07-12 NOTE — Assessment & Plan Note (Signed)
This continues to be an ongoing barrier for the patient Patient struggles with follow-ups He never completed follow-up with [facility] pulmonary Thankfully he brought a friend with him today Friends name: Leola Brazil Patient continues to struggle with acute care follow-up with [place] system due to previous history of having an issue with the cardiology team  Discussion: I had a very honest and frank discussion with the patient today.  I explained that if he is going to establish with our pulmonary team and to continue to follow-up with [place] then he needs to be willing to be seen in acute care facility with [facility] health.  This will allow our pulmonary team to also see him when hospitalized.  It continues to be a struggle that the patient sees the [place], [place], [facility] as well as our team intermittently and with no regular follow-up.  The patient is committed to follow-up with our office as well as be willing to be seen at a [place] facility if he needs to seek acute care so that way our pulmonary team can manage.  Plan: 4-week follow-up with our office

## 2019-07-12 NOTE — Assessment & Plan Note (Signed)
Last seen in November/2020 At patient's request we referred to [facility] pulmonary, patient never completed this follow-up At last office visit patient was informed he needs 8 L continuous with physical exertion Patient presenting on 5 L and telling me he has no idea what he needs to use with physical exertion 2019 high-resolution CT chest showed progression of UIP fibrosis Pulmonary function testing from 2018 shows a DLCO 9.54 (25% predicted) Patient is intolerant of antifibrotic's-has been tried on Ofev as well as Esbriet  Plan: High-resolution CT chest ordered today Close follow-up with our office Referred to hospice for end-stage care as well as in-home help and support 4-week follow-up with Dr. [name] after high-resolution CT chest Walk today in office to assess oxygen needs

## 2019-07-12 NOTE — Assessment & Plan Note (Signed)
Plan: Continue CPAP therapy as managed by the [facility]

## 2019-07-15 ENCOUNTER — Ambulatory Visit (INDEPENDENT_AMBULATORY_CARE_PROVIDER_SITE_OTHER)
Admission: RE | Admit: 2019-07-15 | Discharge: 2019-07-15 | Disposition: A | Discharge status: Home / Self Care | Payer: Medicare HMO | Source: Ambulatory Visit | Attending: Pulmonary Disease | Admitting: Pulmonary Disease

## 2019-07-15 ENCOUNTER — Other Ambulatory Visit: Payer: Self-pay

## 2019-07-15 DIAGNOSIS — R0602 Shortness of breath: Secondary | ICD-10-CM | POA: Diagnosis not present

## 2019-07-15 DIAGNOSIS — J849 Interstitial pulmonary disease, unspecified: Secondary | ICD-10-CM | POA: Diagnosis not present

## 2019-08-13 ENCOUNTER — Ambulatory Visit: Payer: Medicare HMO | Admitting: Pulmonary Disease

## 2019-08-13 ENCOUNTER — Encounter: Payer: Self-pay | Admitting: Pulmonary Disease

## 2019-08-13 ENCOUNTER — Other Ambulatory Visit: Payer: Self-pay

## 2019-08-13 DIAGNOSIS — J9611 Chronic respiratory failure with hypoxia: Secondary | ICD-10-CM | POA: Diagnosis not present

## 2019-08-13 DIAGNOSIS — J849 Interstitial pulmonary disease, unspecified: Secondary | ICD-10-CM | POA: Diagnosis not present

## 2019-08-13 DIAGNOSIS — J449 Chronic obstructive pulmonary disease, unspecified: Secondary | ICD-10-CM | POA: Diagnosis not present

## 2019-08-13 MED ORDER — IPRATROPIUM-ALBUTEROL 0.5-2.5 (3) MG/3ML IN SOLN: 3.0000 mL | Freq: Four times a day (QID) | RESPIRATORY_TRACT | 1 refills | Status: AC | PRN

## 2019-08-13 NOTE — Patient Instructions (Signed)
Continue on 5 L oxygen at rest and 8 L on walking  Prescription for DuoNebs every 6 hours will be sent to pharmacy
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
## 2019-08-14 NOTE — Assessment & Plan Note (Signed)
Based on prior PFTs, I think he should continue on duo nebs every 6 hours

## 2019-08-14 NOTE — Assessment & Plan Note (Signed)
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 He has not tolerated antifibrotic therapy.  Given his severe hypoxia he may be approaching end-stage and hospice/outpatient palliative care is appropriate. He still expressed that he wants to live for a long time.  I clearly explained to him that life support measures such as ventilator would not be helpful to him should he have further disease progression and worsening of his hypoxia.  I encouraged him to discuss his advanced directives with his daughters.  His friend Leola Brazil was present and witnessed to this discussion

## 2019-08-14 NOTE — Assessment & Plan Note (Signed)
We have clearly demonstrated on previous occasions and again today that he requires up to 5 L continuous O2 at rest and up to 8 L on exertion I will be happy to write him prescriptions for this level of oxygen as needed if he cannot get this from the [place] for some reason

## 2019-11-19 ENCOUNTER — Ambulatory Visit: Payer: Medicare HMO | Admitting: Pulmonary Disease

## 2019-11-19 ENCOUNTER — Other Ambulatory Visit: Payer: Self-pay

## 2019-11-19 ENCOUNTER — Encounter: Payer: Self-pay | Admitting: Pulmonary Disease

## 2019-11-19 DIAGNOSIS — J449 Chronic obstructive pulmonary disease, unspecified: Secondary | ICD-10-CM

## 2019-11-19 DIAGNOSIS — J9611 Chronic respiratory failure with hypoxia: Secondary | ICD-10-CM | POA: Diagnosis not present

## 2019-11-19 DIAGNOSIS — J849 Interstitial pulmonary disease, unspecified: Secondary | ICD-10-CM | POA: Diagnosis not present

## 2019-11-19 NOTE — Assessment & Plan Note (Addendum)
Stay on 4 L of oxygen at rest and during sleep. Okay to increase to 6 to 8 L of oxygen while walking.  Call me if you develop leg swelling or increased oxygen requirements He is on outpatient palliative care and this is appropriate DNR has been discussed and I have encouraged him to discuss with his daughters Dyspnea is not bad enough to require narcotics at this time I discussed the course of progressive ILD with him

## 2019-11-19 NOTE — Assessment & Plan Note (Signed)
Continue duo nebs. 

## 2019-11-19 NOTE — Assessment & Plan Note (Signed)
Did not tolerate antifibrotic therapies and we will not try again. Cough is tolerable at this time, if gets worse may try low-dose prednisone

## 2019-11-19 NOTE — Progress Notes (Signed)
° °  Subjective:    Patient ID: Bruce Winters, male    DOB: 04-20-1945, 74 y.o.   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  He does not want to be admitted to [facility] health hospitals but wishes to keep his follow-up in our office, he has seen [facility] pulmonary in the past  Friend and caretaker who accompanies, Bruce Winters [id number]([phone]). Did not tolerate antifibrotic's, he had violent dreams when taking Esbriet. He  had significant diarrhea and fatigue with Ofev   He continues to have significant shortness of breath on routine activities such as taking a shower or shaving.  He cannot go without oxygen even for short.'s.  He uses 4 L at rest and desaturates on ambulation at this level. He has [facility] visiting him at home and they have been helpful Reports cough but this does not wake him up at night No pedal edema  He is compliant with his CPAP every night  Significant tests/ events reviewed  06/2019 HRCT >> severe emphysema, mediastinal lymph nodes, UIP pattern 08/15/2017-connective tissue work-up ANA titer-negative Double-stranded DNA AMB-negative Sjogren's syndrome antibodies-negative Anti-Jo 1-negative Hepatic function panel-stable  05/25/2016-CT chest high-res-pulmonary parenchymal pattern of ILD appears stable from prior exams favoring fibrotic NSIP, superimposed on moderate to severe emphysema  08/10/2017-CT chest high-res-progressive interstitial lung disease better with typical UIP CT pattern as above, diffuse bronchial wall thickening with moderate to severe centrilobular and paraseptal emphysema, pattern of mid to upper lung micro nodularity much of which is calcified again suggestive of  superimposed silicosis  05/09/2018-echocardiogram-LV ejection fraction 55 to 60%, right ventricle is normal systolic function, cavity was normal, no increase in right ventricle wall thickness  08/15/2016-pulmonary function test-FVC 4.02 (79% predicted), postbronchodilator ratio 81, postbronchodilator FEV1 3.34 (90% predicted), no bronchodilator response, mid flow reversibility, DLCO 9.54 (25% predicted)  Review of Systems Patient denies significant cough, hemoptysis,  chest pain, palpitations, pedal edema, orthopnea, paroxysmal nocturnal dyspnea, lightheadedness, nausea, vomiting, abdominal or  leg pains      Objective:   Physical Exam   Gen. Pleasant, well-nourished, in no distress ENT - no thrush, no pallor/icterus,no post nasal drip Neck: No JVD, no thyromegaly, no carotid bruits Lungs: no use of accessory muscles, no dullness to percussion, bibasal rales no rhonchi  Cardiovascular: Rhythm regular, heart sounds  normal, no murmurs or gallops, no peripheral edema Musculoskeletal: No deformities, no cyanosis or clubbing         Assessment & Plan:

## 2019-11-19 NOTE — Patient Instructions (Signed)
Stay on 4 L of oxygen at rest and during sleep. Okay to increase to 6 to 8 L of oxygen while walking.  Call me if you develop leg swelling or increased oxygen requirements

## 2019-11-25 ENCOUNTER — Telehealth: Payer: Self-pay | Admitting: Pulmonary Disease

## 2019-11-25 NOTE — Telephone Encounter (Signed)
Spoke with patient regarding message. Patient stated he is SOB and on  6 liters of O2 and pulse was at 88 at the time of phone call. Made a f/u with BM on 8/31 at 3:30. Advised patient if O2 goes down he needs to go to the ED. Patient's voice was understanding. Nothing else further needed.

## 2019-11-26 ENCOUNTER — Ambulatory Visit: Payer: Medicare HMO | Admitting: Pulmonary Disease

## 2019-11-26 ENCOUNTER — Other Ambulatory Visit: Payer: Self-pay

## 2019-11-26 ENCOUNTER — Encounter: Payer: Self-pay | Admitting: Pulmonary Disease

## 2019-11-26 ENCOUNTER — Ambulatory Visit (INDEPENDENT_AMBULATORY_CARE_PROVIDER_SITE_OTHER): Payer: Medicare HMO

## 2019-11-26 VITALS — BP 130/82 | HR 82 | Temp 96.0°F | Ht 75.0 in | Wt 230.0 lb

## 2019-11-26 DIAGNOSIS — J9611 Chronic respiratory failure with hypoxia: Secondary | ICD-10-CM | POA: Diagnosis not present

## 2019-11-26 DIAGNOSIS — J849 Interstitial pulmonary disease, unspecified: Secondary | ICD-10-CM

## 2019-11-26 DIAGNOSIS — Z7189 Other specified counseling: Secondary | ICD-10-CM | POA: Diagnosis not present

## 2019-11-26 MED ORDER — PREDNISONE 10 MG PO TABS: ORAL_TABLET | ORAL | 0 refills | Status: AC

## 2019-11-26 NOTE — Assessment & Plan Note (Signed)
Plan: Continue to work with home hospice

## 2019-11-26 NOTE — Patient Instructions (Addendum)
You were seen today by Coral Ceo, NP  for:   1. ILD (interstitial lung disease) (HCC) 2. Chronic respiratory failure with hypoxia (HCC) 3. Coordination of complex care  Walk today in office -required 15 L with physical exertion today  We will coordinate with hospice to have closer follow-up to further evaluate oxygen needs.  Also to see if hospice MD can provide recommendations regarding management of shortness of breath to help improve quality life  Continue oxygen therapy as prescribed  >>>maintain oxygen saturations greater than 88 percent  >>>if unable to maintain oxygen saturations please contact the office  >>>do not smoke with oxygen  >>>can use nasal saline gel or nasal saline rinses to moisturize nose if oxygen causes dryness  Continue to work with home hospice  Keep follow-up with our office with Dr. [name]  Follow Up:    Return in about 3 months (around 02/25/2020), or if symptoms worsen or fail to improve, for Follow up with Dr. [name].   Please do your part to reduce the spread of COVID-19:      Reduce your risk of any infection  and COVID19 by using the similar precautions used for avoiding the common cold or flu:  Marland Kitchen Wash your hands often with soap and warm water for at least 20 seconds.  If soap and water are not readily available, use an alcohol-based hand sanitizer with at least 60% alcohol.  . If coughing or sneezing, cover your mouth and nose by coughing or sneezing into the elbow areas of your shirt or coat, into a tissue or into your sleeve (not your hands). Drinda Butts A MASK when in public  . Avoid shaking hands with others and consider head nods or verbal greetings only. . Avoid touching your eyes, nose, or mouth with unwashed hands.  . Avoid close contact with people who are sick. . Avoid places or events with large numbers of people in one location, like concerts or sporting events. . If you have some symptoms but not all symptoms, continue to monitor at  home and seek medical attention if your symptoms worsen. . If you are having a medical emergency, call 911.   ADDITIONAL HEALTHCARE OPTIONS FOR PATIENTS  [place] [facility] / e-Visit: [email or web address]         [facility]: [phone]  [facility]: [phone]                   [facility]: [phone]     It is flu season:   >>> Best ways to protect herself from the flu: Receive the yearly flu vaccine, practice good hand hygiene washing with soap and also using hand sanitizer when available, eat a nutritious meals, get adequate rest, hydrate appropriately   Please contact the office if your symptoms worsen or you have concerns that you are not improving.   Thank you for choosing [facility] for your healthcare, and for allowing [place] to partner with you on your healthcare journey. I am thankful to be able to provide care to you today.   Elisha Headland FNP-C

## 2019-11-26 NOTE — Assessment & Plan Note (Signed)
Did not tolerate antifibrotic's in the past Cough has not worsened With worsened dyspnea will prescribe prednisone today Chest x-ray today  Plan:  Prednisone taper today Chest x-ray today Continue oxygen therapy

## 2019-11-26 NOTE — Progress Notes (Signed)
@Patient  ID: Bruce Winters, male    DOB: 04-Feb-1946, 74 y.o.   MRN: [id number]  Chief Complaint  Patient presents with  . Follow-up    pt has been extremely sob when doing activities    Referring provider: [id number], MD  HPI:  74 year old male former smoker followed in our office for interstitial lung disease, chronic respiratory failure and obstructive sleep apnea  PMH: Congestive heart failure, hypertension, dyslipidemia Smoker/ Smoking History: Former smoker.  Quit 1996.  22.5-pack-year smoking history. Maintenance: none  [name]: Dr. 1997  11/26/2019  - Visit   74 year old male former smoker upon our office for interstitial lung disease, chronic respiratory failure and obstructive sleep apnea.  Patient is intolerant of antifibrotic's.  He is currently established with hospice therapy due to his known interstitial lung disease and chronic respiratory failure.  Patient is followed in our office by Dr. 66.  Last seen in our office on 11/19/2019.  Plan of care at that office visit was to remain on 4 L of O2 at rest and during sleep, okay to increase to 6-8L of oxygen while walking.  He was encouraged to discuss with family whether or not he would prefer to be a DNR.  He is on outpatient palliative/hospice therapies.  He did not tolerate antifibrotic's.  Could consider low-dose prednisone if cough worsens.  Patient was requested to follow-up with our office in 3 months.  We received a call from [facility] stating the patient is having increased shortness of breath and his oxygen levels were dropping into the 70s with exertion.  He is scheduled for a follow-up with our office on 11/26/2019.  Patient presenting to office today reporting that he has had episodes of hypoxemia with physical exertion.  He was maintained at 6 L at that time.  Continues to have occasional clear/yellow congestion.  He reports this is his baseline.  Patient continues to work with home  hospice.  Questionaires / Pulmonary Flowsheets:   ACT:  No flowsheet data found.  MMRC: mMRC Dyspnea Scale mMRC Score  11/26/2019 3  07/12/2019 4    Epworth:  No flowsheet data found.  Tests:   06/2019 HRCT >> severe emphysema, mediastinal lymph nodes, UIP pattern 08/15/2017-connective tissue work-up ANA titer-negative Double-stranded DNA AMB-negative Sjogren's syndrome antibodies-negative Anti-Jo 1-negative Hepatic function panel-stable  05/25/2016-CT chest high-res-pulmonary parenchymal pattern of ILD appears stable from prior exams favoring fibrotic NSIP, superimposed on moderate to severe emphysema  08/10/2017-CT chest high-res-progressive interstitial lung disease better with typical UIP CT pattern as above, diffuse bronchial wall thickening with moderate to severe centrilobular and paraseptal emphysema, pattern of mid to upper lung micro nodularity much of which is calcified again suggestive of superimposed silicosis  05/09/2018-echocardiogram-LV ejection fraction 55 to 60%, right ventricle is normal systolic function, cavity was normal, no increase in right ventricle wall thickness  08/15/2016-pulmonary function test-FVC 4.02 (79% predicted), postbronchodilator ratio 81, postbronchodilator FEV1 3.34 (90% predicted), no bronchodilator response, mid flow reversibility, DLCO 9.54 (25% predicted)  FENO:  No results found for: NITRICOXIDE  PFT: PFT Results Latest Ref Rng & Units 08/15/2017 05/20/2016 04/14/2015  FVC-Pre L 4.02 4.42 4.25  FVC-Predicted Pre % 79 86 85  FVC-Post L 4.11 4.41 4.49  FVC-Predicted Post % 81 86 90  Pre FEV1/FVC % % 80 78 84  Post FEV1/FCV % % 81 80 81  FEV1-Pre L 3.23 3.45 3.56  FEV1-Predicted Pre % 87 92 97  FEV1-Post L 3.34 3.53 3.63  DLCO uncorrected ml/min/mmHg 9.54  12.40 16.31  DLCO UNC% % 25 32 44  DLVA Predicted % 36 45 60  TLC L 4.17 6.65 -  TLC % Predicted % 53 84 -  RV % Predicted % 15 84 -    WALK:  SIX MIN WALK 07/12/2019  01/30/2019 12/24/2018 07/31/2017 07/06/2016 05/20/2016 10/19/2015  Supplimental Oxygen during Test? (L/min) Yes Yes Yes No No - No  O2 Flow Rate 6 8 4  - - - -  Type Continuous Continuous Pulse - - - -  Tech Comments: after 1/2 lap sats decreased to 89% 6lpm cont o2-- increased o2 flow to 8lpm and sats increased to 94% and pt walked additional 1/2 lap with sats ending at 95% 8lpm, pulse 100. Pt walked average pace and stopped to rest due to SOB x 2//lmr Patient was very short of breath, needing to slow pace, catch breath and adjust oxygen during walk. Patient wanted to stay on pulsed but needs 8 L of continuous flow.//btr Walked 1 lap, had to stop and rest 1 time before returning to the room. Patient only completed 1 lap. O2 dropped down to 84% O2 increased to 88%.  Pt walked at moderate pace with steady gait.  fast steady walk/no complications/TA/CMA no complaints    Imaging: No results found.  Lab Results:  CBC    Component Value Date/Time   WBC 9.1 01/30/2019 1122   RBC 4.42 01/30/2019 1122   HGB 13.5 01/30/2019 1122   HCT 40.1 01/30/2019 1122   PLT 252.0 01/30/2019 1122   MCV 90.8 01/30/2019 1122   MCH 31.0 01/04/2011 0420   MCHC 33.6 01/30/2019 1122   RDW 14.2 01/30/2019 1122   LYMPHSABS 1.3 01/30/2019 1122   MONOABS 0.7 01/30/2019 1122   EOSABS 0.1 01/30/2019 1122   BASOSABS 0.1 01/30/2019 1122    BMET    Component Value Date/Time   NA 135 01/30/2019 1122   K 4.3 01/30/2019 1122   CL 97 01/30/2019 1122   CO2 31 01/30/2019 1122   GLUCOSE 95 01/30/2019 1122   BUN 14 01/30/2019 1122   CREATININE 1.00 01/30/2019 1122   CALCIUM 9.5 01/30/2019 1122   GFRNONAA >90 01/04/2011 0420   GFRAA >90 01/04/2011 0420    BNP No results found for: BNP  ProBNP    Component Value Date/Time   PROBNP 61.0 01/30/2019 1122    Specialty Problems      Pulmonary Problems   PULMONARY NODULE, RIGHT MIDDLE LOBE    Qualifier: Diagnosis of  By: Truman HaywardFord,CMA Duncan Dull(AAMA), Susanne        COPD GOLD 0     Quit smoking 1996  Emphysema on CT , but no obs on PFT       PULMONARY FIBROSIS    First reported on CT 05/2007  - 01/01/2015  Walked RA x 3 laps @ 185 ft each stopped due to sob/ cough nl pace  CT chest 12/2014 >New prominent dense peribronchovascular nodularity in the upper lobes, 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 2. 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 3. 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 Penicillins Anaphylaxis  . Aricept [Donepezil]   . Rivastigmine     Immunization History  Administered Date(s) Administered  . 19-influenza Whole 11/23/2018  . Influenza Split 11/27/2014  . Influenza, High Dose Seasonal PF 12/12/2016, 11/28/2018  .  Influenza, Seasonal, Injecte, Preservative Fre 01/27/2016  . Influenza,inj,Quad PF,6+ Mos 01/27/2016  . Moderna SARS-COVID-2 Vaccination 05/10/2019, 06/07/2019  . Pneumococcal Conjugate-13 12/12/2016  . Zoster Recombinat (Shingrix) 07/11/2017, 09/13/2017    Past Medical History:  Diagnosis Date  . Heart attack (HCC)   . Hyperlipidemia   . OSA (obstructive sleep apnea)    on CPAP     Tobacco History: Social History   Tobacco Use  Smoking Status Former Smoker  . Packs/day: 1.50  . Years: 15.00  . Pack years: 22.50  . Types: Cigarettes  . Quit date: 03/28/1994  . Years since quitting: 25.6  Smokeless Tobacco Never Used   Counseling given: Yes   Continue to not smoke  Outpatient Encounter Medications as of 11/26/2019  Medication Sig  . albuterol (PROVENTIL) (2.5 MG/3ML) 0.083% nebulizer solution Take 2.5 mg by nebulization every 6 (six) hours as needed for wheezing or shortness of breath.  Marland Kitchen amLODipine (NORVASC) 5 MG tablet Take 5 mg by mouth daily.  Marland Kitchen aspirin 81 MG tablet Take 81 mg by mouth daily.  . clindamycin (CLEOCIN) 300 MG capsule Take 300 mg by mouth 3 (three) times daily.  . colestipol (COLESTID) 1 g tablet Take 1 g by mouth 2 (two) times daily. As needed  . FLUoxetine (PROZAC) 20 MG capsule Take 20 mg by mouth daily.  Marland Kitchen guaifenesin (HUMIBID E) 400 MG TABS tablet Take 400 mg by mouth every 4 (four) hours.  Marland Kitchen ibuprofen (ADVIL) 600 MG tablet Take 600 mg by mouth every 6 (six) hours as needed.  Marland Kitchen ipratropium-albuterol (DUONEB) 0.5-2.5 (3) MG/3ML SOLN Take 3 mLs by nebulization every 6 (six) hours as needed.  . memantine (NAMENDA) 10 MG tablet Take 20 mg by mouth at bedtime.  Marland Kitchen morphine (KADIAN) 100 MG 24 hr capsule Take 100 mg by mouth daily. Every 3 hours  . Multiple Vitamins-Minerals (CENTRUM PO) Take 1 tablet by mouth daily.  Marland Kitchen omeprazole (PRILOSEC) 40 MG capsule Take 40 mg by mouth daily.  . rivastigmine (EXELON) 3 MG capsule Take 3 mg by mouth 2 (two) times daily.   . traZODone (DESYREL) 100 MG tablet Take 100 mg by mouth at bedtime as needed for sleep.   Marland Kitchen albuterol (VENTOLIN HFA) 108 (90 Base) MCG/ACT inhaler Inhale 2 puffs into the lungs every 6 (six) hours as needed for wheezing or shortness of breath.  Marland Kitchen  nitroGLYCERIN (NITROSTAT) 0.4 MG SL tablet Place 1 tablet (0.4 mg total) under the tongue every 5 (five) minutes as needed for chest pain.  . predniSONE (DELTASONE) 10 MG tablet Take 3 tablets (30 mg total) by mouth daily with breakfast for 7 days, THEN 2 tablets (20 mg total) daily with breakfast for 7 days, THEN 1 tablet (10 mg total) daily with breakfast for 7 days.   No facility-administered encounter medications on file as of 11/26/2019.     Review of Systems  Review of Systems  Constitutional: Positive for fatigue. Negative for activity change, chills, fever and unexpected weight change.  HENT: Positive for congestion and rhinorrhea. Negative for postnasal drip, sinus pressure, sinus pain and sore throat.   Eyes: Negative.   Respiratory: Positive for cough and shortness of breath. Negative for wheezing.   Cardiovascular: Negative for chest pain and palpitations.  Gastrointestinal: Negative for constipation, diarrhea, nausea and vomiting.  Endocrine: Negative.   Genitourinary: Negative.   Musculoskeletal: Negative.   Skin: Negative.   Neurological: Negative for dizziness and headaches.  Psychiatric/Behavioral: Negative.  Negative for dysphoric mood. The patient is not nervous/anxious.   All other systems reviewed and are negative.    Physical Exam  BP 130/82 (BP Location: Left Arm, Cuff Size: Normal)   Pulse 82   Temp (!) 96 F (35.6 C) (Oral)   Ht 6\' 3"  (1.905 m)   Wt 230 lb (104.3 kg)   SpO2 98% Comment: 6 liters oxygen  BMI 28.75 kg/m   Wt Readings from Last 5 Encounters:  11/26/19 230 lb (104.3 kg)  11/19/19 224 lb 12.8 oz (102 kg)  08/13/19 236 lb (107 kg)  07/12/19 237 lb 9.6 oz (107.8 kg)  02/28/19 248 lb (112.5 kg)     BMI Readings from Last 5 Encounters:  11/26/19 28.75 kg/m  11/19/19 28.86 kg/m  08/13/19 30.30 kg/m  07/12/19 30.51 kg/m  02/28/19 31.84 kg/m     Physical Exam Vitals and nursing note reviewed.  Constitutional:      General: He is not in acute distress.    Appearance: Normal appearance. He is obese.  HENT:     Head: Normocephalic and atraumatic.     Right Ear: Hearing and external ear normal.     Left Ear: Hearing and external ear normal.     Nose: Nose normal. No mucosal edema or rhinorrhea.     Right Turbinates: Not enlarged.     Left Turbinates: Not enlarged.     Mouth/Throat:     Mouth: Mucous membranes are dry.     Pharynx: Oropharynx is clear. No oropharyngeal exudate.  Eyes:     Pupils: Pupils are equal, round, and reactive to light.  Cardiovascular:     Rate and Rhythm: Normal rate and regular rhythm.     Pulses: Normal pulses.     Heart sounds: Normal heart sounds. No murmur heard.   Pulmonary:     Effort: Pulmonary effort is normal.     Breath sounds: Rales present. No decreased breath sounds or wheezing.  Abdominal:     General: Bowel sounds are normal. There is no distension.     Palpations: Abdomen is soft.     Tenderness: There is no abdominal tenderness.  Musculoskeletal:     Cervical back: Normal range of motion.     Right lower leg: No edema.     Left lower leg: No edema.  Lymphadenopathy:     Cervical: No cervical adenopathy.  Skin:  General: Skin is warm and dry.     Capillary Refill: Capillary refill takes less than 2 seconds.     Findings: No erythema or rash.  Neurological:     General: No focal deficit present.     Mental Status: He is alert and oriented to person, place, and time.     Motor: No weakness.     Coordination: Coordination normal.     Gait: Gait is intact. Gait normal.  Psychiatric:        Mood and Affect: Mood normal.        Behavior: Behavior normal. Behavior is cooperative.        Thought Content: Thought  content normal.        Judgment: Judgment normal.       Assessment & Plan:   Respiratory failure, chronic (HCC) Increased oxygen needs Walk today in office required 15 L *Forehead probes were not available for walk, patient was walked with pulse oximeter  Plan: Suspect patient is having worsened progression of interstitial lung disease Contacted home hospice to notify of patient's increased oxygen needs as well as worsened symptoms of dyspnea Once again emphasized patient should be a DNR, he should discuss this with his family members Reviewed that ILD is progressive Continue oxygen therapy Notified hospice that oxygen needs have increased  ILD (interstitial lung disease) (HCC) Did not tolerate antifibrotic's in the past Cough has not worsened With worsened dyspnea will prescribe prednisone today Chest x-ray today  Plan:  Prednisone taper today Chest x-ray today Continue oxygen therapy  Coordination of complex care Plan: Continue to work with home hospice    Return in about 3 months (around 02/25/2020), or if symptoms worsen or fail to improve, for Follow up with Dr. [name].   Coral Ceo, NP 11/26/2019   This appointment required 33 minutes of patient care (this includes precharting, chart review, review of results, face-to-face care, etc.).

## 2019-11-26 NOTE — Assessment & Plan Note (Signed)
Increased oxygen needs Walk today in office required 15 L *Forehead probes were not available for walk, patient was walked with pulse oximeter  Plan: Suspect patient is having worsened progression of interstitial lung disease Contacted home hospice to notify of patient's increased oxygen needs as well as worsened symptoms of dyspnea Once again emphasized patient should be a DNR, he should discuss this with his family members Reviewed that ILD is progressive Continue oxygen therapy Notified hospice that oxygen needs have increased

## 2019-11-28 ENCOUNTER — Telehealth: Payer: Self-pay | Admitting: Pulmonary Disease

## 2019-11-28 NOTE — Telephone Encounter (Signed)
11/28/2019  Received telephone call from hospice RN.  They were able to evaluate the patient in person.  Oxygen level stable between 88 and 94% on 8 L at rest.  Patient's concentrator is up to 10 L.  Patient instructed to be on 10 L with physical exertion.  Hospice RN will review ability to use morphine to help with dyspnea.  Hospice RN will also work with hospice team to ensure patient can have access to larger concentrator to meet exertional oxygen needs of 15 L.  Patient in no apparent or worsened distress today based off of physical exam sample hospice RN.  Elisha Headland, FNP

## 2020-01-15 ENCOUNTER — Telehealth: Payer: Self-pay | Admitting: Pulmonary Disease

## 2020-01-15 NOTE — Telephone Encounter (Signed)
01/15/2020  Can we please get a condolence card started and signed.  Patient recently passed away in [facility].  I was notified by hospice.  We will route to Dr. [name] as [place]ain Childes.  Please ensure that Dr. [name] and I can sign the card before we mail it.  Elisha Headland, FNP

## 2020-01-16 NOTE — Telephone Encounter (Signed)
Card handed to [place] to have Dr. [name] sign  Bruce Headland, FNP

## 2020-01-16 NOTE — Telephone Encounter (Signed)
Condolence card placed for Elisha Headland and Dr [name] to sign. Nothing further needed.

## 2020-01-16 NOTE — Telephone Encounter (Signed)
Thanks to the team for taking care of him

## 2020-01-27 DEATH — deceased

## 2020-02-19 ENCOUNTER — Ambulatory Visit: Payer: Medicare HMO | Admitting: Pulmonary Disease
